# Patient Record
Sex: Female | Born: 1970
Health system: Southern US, Community
[De-identification: ages and names within clinical notes are randomized; demographics above are authoritative.]

## PROBLEM LIST (undated history)

## (undated) DIAGNOSIS — Z9889 Other specified postprocedural states: Secondary | ICD-10-CM

## (undated) DIAGNOSIS — R5382 Chronic fatigue, unspecified: Secondary | ICD-10-CM

## (undated) DIAGNOSIS — T8859XA Other complications of anesthesia, initial encounter: Secondary | ICD-10-CM

## (undated) DIAGNOSIS — Z789 Other specified health status: Secondary | ICD-10-CM

## (undated) DIAGNOSIS — E039 Hypothyroidism, unspecified: Secondary | ICD-10-CM

## (undated) DIAGNOSIS — E1165 Type 2 diabetes mellitus with hyperglycemia: Secondary | ICD-10-CM

## (undated) DIAGNOSIS — R112 Nausea with vomiting, unspecified: Secondary | ICD-10-CM

## (undated) DIAGNOSIS — M797 Fibromyalgia: Secondary | ICD-10-CM

## (undated) DIAGNOSIS — F410 Panic disorder [episodic paroxysmal anxiety] without agoraphobia: Secondary | ICD-10-CM

## (undated) DIAGNOSIS — IMO0001 Reserved for inherently not codable concepts without codable children: Secondary | ICD-10-CM

## (undated) DIAGNOSIS — D649 Anemia, unspecified: Secondary | ICD-10-CM

## (undated) DIAGNOSIS — T4145XA Adverse effect of unspecified anesthetic, initial encounter: Secondary | ICD-10-CM

## (undated) DIAGNOSIS — E079 Disorder of thyroid, unspecified: Secondary | ICD-10-CM

## (undated) HISTORY — PX: OTHER SURGICAL HISTORY: SHX169

## (undated) HISTORY — DX: Disorder of thyroid, unspecified: E07.9

## (undated) HISTORY — DX: Panic disorder (episodic paroxysmal anxiety): F41.0

## (undated) HISTORY — DX: Chronic fatigue, unspecified: R53.82

## (undated) HISTORY — DX: Anemia, unspecified: D64.9

## (undated) HISTORY — DX: Fibromyalgia: M79.7

## (undated) HISTORY — DX: Reserved for inherently not codable concepts without codable children: IMO0001

## (undated) HISTORY — PX: POLYPECTOMY: SHX149

## (undated) HISTORY — DX: Type 2 diabetes mellitus with hyperglycemia: E11.65

---
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
## 2006-01-16 ENCOUNTER — Inpatient Hospital Stay (HOSPITAL_COMMUNITY): Admission: AD | Admit: 2006-01-16 | Discharge: 2006-01-17 | Payer: Self-pay | Admitting: Psychiatry

## 2006-01-16 ENCOUNTER — Ambulatory Visit: Payer: Self-pay | Admitting: Psychiatry

## 2010-02-04 ENCOUNTER — Ambulatory Visit (HOSPITAL_COMMUNITY): Admission: RE | Admit: 2010-02-04 | Discharge: 2010-02-04 | Payer: Self-pay | Admitting: Obstetrics and Gynecology

## 2010-03-24 ENCOUNTER — Ambulatory Visit (HOSPITAL_COMMUNITY): Admission: RE | Admit: 2010-03-24 | Discharge: 2010-03-24 | Payer: Self-pay | Admitting: Obstetrics and Gynecology

## 2010-08-17 LAB — CBC
HCT: 39.8 % (ref 36.0–46.0)
MCH: 31 pg (ref 26.0–34.0)
MCHC: 34.1 g/dL (ref 30.0–36.0)
MCV: 90.8 fL (ref 78.0–100.0)
Platelets: 255 10*3/uL (ref 150–400)
RDW: 12.7 % (ref 11.5–15.5)
WBC: 7.3 10*3/uL (ref 4.0–10.5)

## 2010-08-18 LAB — CBC
HCT: 39.3 % (ref 36.0–46.0)
Hemoglobin: 13.3 g/dL (ref 12.0–15.0)
MCH: 30.9 pg (ref 26.0–34.0)
MCHC: 33.8 g/dL (ref 30.0–36.0)
MCV: 91.5 fL (ref 78.0–100.0)
RDW: 13 % (ref 11.5–15.5)

## 2010-10-21 NOTE — Discharge Summary (Signed)
Sherri Cross, KINKEAD NO.:  [id number]   MEDICAL RECORD NO.:  [id number]          PATIENT TYPE:  IPS   LOCATION:  0302                          FACILITY:  [facility]   PHYSICIAN:  Geoffery Lyons, M.D.      DATE OF BIRTH:  [birth year]   DATE OF ADMISSION:  01/16/2006  DATE OF DISCHARGE:  01/17/2006                                 DISCHARGE SUMMARY   CHIEF COMPLAINT AND PRESENT ILLNESS:  This was the first admission to [facility] for this 40 year old female from [place].  Voluntarily  admitted.  Referred by her PCP after she expressed to them that she has had  suicidal thoughts of hanging herself.  Upon this evaluation, she denied any  intent for harm.  Endorsed depressed mood, low energy, anhedonia, decreased  motivation and concentration.  On Lexapro for two weeks.  Poor results and  side effects.  Her PCP is new for her and instructed her to come to the  hospital or will petition.   PAST PSYCHIATRIC HISTORY:  First time at [facility].  Managed by her  PCP.  History of depression.  Triggered by situational stressors.   MEDICAL HISTORY:  Noncontributory.   MEDICATIONS:  Lexapro 20 mg with involuntary muscle twitching and sexual  side effects and hyperhidrosis.   PHYSICAL EXAMINATION:  Performed and failed to show any acute findings.   LABORATORY DATA:  Not available in the chart.   MEDICATIONS:  Lexapro 20 mg per day, Klonopin 0.25 mg twice a day, Singulair  and Zyrtec.   MENTAL STATUS EXAM:  Alert, cooperative female.  Pleasant, cooperative.  Bright affect.  Speech normal rate, tempo and production.  Mood euthymic.  Affect broad.  Thought processes logical, coherent and relevant.  Endorsed  no suicidal or homicidal ideation.  Endorsed that she did not need to be in  the hospital.  Wanting to be discharged.  No evidence of delusions.  No  suicidal or homicidal ideation.  No hallucinations.  No delusions.   ADMISSION DIAGNOSES:  AXIS I:  Major  depression, recurrent.  AXIS II:  No diagnosis.  AXIS III:  No diagnosis.  AXIS IV:  Moderate.  AXIS V:  GAF upon admission 35; highest GAF in the last year 75.   HOSPITAL COURSE:  She was admitted and she was started in individual and  group psychotherapy.  We maintained the Klonopin, Zyrtec, Singulair.  Lexapro was discontinued and she had already started weaning off it and she  was started on Effexor XR 37.5 mg per day.  She shared that some of the  stressors were her parents, financial problems and the __________ petition  that she will take care of them.  When she was told that she was going to be  discharged to make the best use out of her stay in the unit, her whole  attitude changed.  Her mood improved.  Her affect became brighter.  She was  very pleasant.  She was insightful.  She tolerated the Effexor well without  the side effects.  She was willing and motivated to pursue outpatient  treatment, so we went ahead and discharged to outpatient follow-up.   DISCHARGE DIAGNOSES:  AXIS I:  Major depression, recurrent.  AXIS II:  No diagnosis.  AXIS III:  No diagnosis.  AXIS IV:  Moderate.  AXIS V:  GAF upon discharge 55-60.   DISCHARGE MEDICATIONS:  1. Effexor XR 37.5 mg per day.  2. Klonopin 0.5 mg, 1/2 a tab twice a day.   FOLLOWUP:  Dr. Wynonia Lawman at the [facility].      Geoffery Lyons, M.D.  Electronically Signed     IL/MEDQ  D:  01/31/2006  T:  01/31/2006  Job:  [id number]

## 2012-06-20 ENCOUNTER — Other Ambulatory Visit: Payer: Self-pay | Admitting: *Deleted

## 2012-06-20 ENCOUNTER — Other Ambulatory Visit: Payer: Self-pay | Admitting: Internal Medicine

## 2012-06-20 DIAGNOSIS — Z1231 Encounter for screening mammogram for malignant neoplasm of breast: Secondary | ICD-10-CM

## 2012-06-20 DIAGNOSIS — N852 Hypertrophy of uterus: Secondary | ICD-10-CM

## 2012-06-26 ENCOUNTER — Ambulatory Visit
Admission: RE | Admit: 2012-06-26 | Discharge: 2012-06-26 | Disposition: A | Discharge status: Home / Self Care | Payer: BC Managed Care – PPO | Source: Ambulatory Visit | Attending: *Deleted | Admitting: *Deleted

## 2012-06-26 DIAGNOSIS — N852 Hypertrophy of uterus: Secondary | ICD-10-CM

## 2012-07-12 ENCOUNTER — Ambulatory Visit
Admission: RE | Admit: 2012-07-12 | Discharge: 2012-07-12 | Disposition: A | Discharge status: Home / Self Care | Payer: BC Managed Care – PPO | Source: Ambulatory Visit | Attending: Internal Medicine | Admitting: Internal Medicine

## 2012-07-12 DIAGNOSIS — Z1231 Encounter for screening mammogram for malignant neoplasm of breast: Secondary | ICD-10-CM

## 2012-07-15 ENCOUNTER — Other Ambulatory Visit: Payer: Self-pay | Admitting: Internal Medicine

## 2012-07-15 DIAGNOSIS — R928 Other abnormal and inconclusive findings on diagnostic imaging of breast: Secondary | ICD-10-CM

## 2013-01-16 ENCOUNTER — Ambulatory Visit: Payer: BC Managed Care – PPO | Admitting: Physician Assistant

## 2013-01-16 VITALS — BP 100/78 | HR 91 | Temp 98.0°F | Resp 16 | Ht 61.0 in | Wt 169.0 lb

## 2013-01-16 DIAGNOSIS — N390 Urinary tract infection, site not specified: Secondary | ICD-10-CM

## 2013-01-16 DIAGNOSIS — R300 Dysuria: Secondary | ICD-10-CM

## 2013-01-16 DIAGNOSIS — R350 Frequency of micturition: Secondary | ICD-10-CM

## 2013-01-16 DIAGNOSIS — R3913 Splitting of urinary stream: Secondary | ICD-10-CM

## 2013-01-16 LAB — POCT URINALYSIS DIPSTICK
Bilirubin, UA: 100
Blood, UA: NEGATIVE
Glucose, UA: NEGATIVE
Nitrite, UA: POSITIVE
Protein, UA: 100
Spec Grav, UA: <=1.005
Urobilinogen, UA: 4
pH, UA: 5

## 2013-01-16 LAB — POCT UA - MICROSCOPIC ONLY
Casts, Ur, LPF, POC: NEGATIVE
Crystals, Ur, HPF, POC: NEGATIVE
Mucus, UA: NEGATIVE
Yeast, UA: NEGATIVE

## 2013-01-16 NOTE — Progress Notes (Signed)
  Subjective:    Patient ID: Sherri Cross, female    DOB: June 26, 1970, 42 y.o.   MRN: [id number]  HPI 42 year old female presents with 3 day history of urinary frequency and dysuria. States symptoms started suddenly and have progressively worsened.  Last night symptoms got acutely worse and so she took OTC Azo for comfort which did help.  Denies abdominal pain, nausea, vomiting, fever, chills, vaginal discharge, or dizziness.  She does have a history of urinary tract infections with the last about 2 years ago. Was seen here and was given Macrobid but she did have an allergy to this.   Patient is otherwise doing well with no other concerns today.      Review of Systems  Constitutional: Negative for fever and chills.  Gastrointestinal: Negative for nausea, vomiting and abdominal pain.  Genitourinary: Positive for dysuria and frequency. Negative for flank pain and vaginal discharge.  Neurological: Negative for dizziness.       Objective:   Physical Exam  Constitutional: She is oriented to person, place, and time. She appears well-developed and well-nourished.  HENT:  Head: Normocephalic and atraumatic.  Right Ear: External ear normal.  Left Ear: External ear normal.  Eyes: Conjunctivae are normal.  Neck: Normal range of motion.  Cardiovascular: Normal rate, regular rhythm and normal heart sounds.   Pulmonary/Chest: Effort normal and breath sounds normal.  Abdominal: Soft. Bowel sounds are normal. There is no tenderness. There is no rebound and no guarding.  Neurological: She is alert and oriented to person, place, and time.  Psychiatric: She has a normal mood and affect. Her behavior is normal. Judgment and thought content normal.          Assessment & Plan:  UTI (urinary tract infection)  Urinary frequency - Plan: POCT UA - Microscopic Only, POCT urinalysis dipstick, Urine culture  Dysuria  Culture from 2012 revealed E. Coli with multi-drug resistance including Cipro,  Septra, and several Cephalosporins Will start on Doxy 100 mg bid x 5 days - given from our stock Urine culture pending - added tetracycline and keflex to sensitivity Follow up if symptoms worsen or fail to improve.

## 2013-01-18 LAB — URINE CULTURE: Colony Count: >=100000

## 2013-10-21 ENCOUNTER — Ambulatory Visit: Payer: BC Managed Care – PPO

## 2013-10-21 ENCOUNTER — Ambulatory Visit: Payer: BC Managed Care – PPO | Admitting: Family Medicine

## 2013-10-21 VITALS — BP 128/84 | HR 90 | Temp 98.1°F | Resp 16

## 2013-10-21 DIAGNOSIS — W19XXXA Unspecified fall, initial encounter: Secondary | ICD-10-CM

## 2013-10-21 DIAGNOSIS — M79671 Pain in right foot: Secondary | ICD-10-CM

## 2013-10-21 DIAGNOSIS — S93402A Sprain of unspecified ligament of left ankle, initial encounter: Secondary | ICD-10-CM

## 2013-10-21 DIAGNOSIS — M79609 Pain in unspecified limb: Secondary | ICD-10-CM

## 2013-10-21 DIAGNOSIS — S93409A Sprain of unspecified ligament of unspecified ankle, initial encounter: Secondary | ICD-10-CM

## 2013-10-21 DIAGNOSIS — S92901A Unspecified fracture of right foot, initial encounter for closed fracture: Secondary | ICD-10-CM

## 2013-10-21 NOTE — Patient Instructions (Signed)
Sherri Cross [phone]   Please bring the copies of your chart to your appointment with you.

## 2013-10-21 NOTE — Progress Notes (Signed)
Is a 43 year old woman who was trying to rescue a dove from her cat when she twisted her right foot and twisted her left ankle. She had immediate swelling [place] had difficulty bearing weight.  Objective: Large ecchymotic area over the right lateral foot and large amount of swelling over the left lateral malleolus. There is tenderness there as well.  [facility] reading (PRIMARY) by  Dr. Joseph Art: Right base of Vth metatarsal fractured with small amount of displacement, left ankle shows STS only.  Assessment: Right metatarsal fracture, fifth, minimally displaced with left ankle sprain  Plan: Refer to orthopedics and splint both areas.  Signed, Ozella Rocks.D.

## 2014-04-09 ENCOUNTER — Other Ambulatory Visit: Payer: Self-pay | Admitting: Orthopedic Surgery

## 2014-04-09 DIAGNOSIS — M79671 Pain in right foot: Secondary | ICD-10-CM

## 2014-04-10 ENCOUNTER — Ambulatory Visit
Admission: RE | Admit: 2014-04-10 | Discharge: 2014-04-10 | Disposition: A | Discharge status: Home / Self Care | Payer: BC Managed Care – PPO | Source: Ambulatory Visit | Attending: Orthopedic Surgery | Admitting: Orthopedic Surgery

## 2014-04-10 DIAGNOSIS — M79671 Pain in right foot: Secondary | ICD-10-CM

## 2015-08-26 IMAGING — CT CT FOOT*R* W/O CM
4 series · 11 of 20 positions shown, 12 images · non-contrast
Comparison: Radiographs dated 10/21/2013

CLINICAL DATA: Right foot pain since a fracture of the base of the
fifth metatarsal 6 months ago.

EXAM:
CT OF THE RIGHT FOOT WITHOUT CONTRAST
TECHNIQUE: Multidetector CT imaging of the right foot was performed according
to the standard protocol. Multiplanar CT image reconstructions were
also generated.

[Series 5: lower ext soft · axial · 0.48mm/px · z∈[-82,+22]mm · 4 of 70 slices shown]
[im 14/70  soft-tissue]
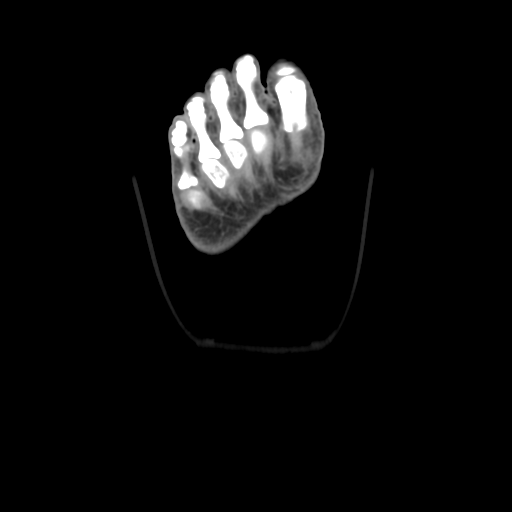
[im 28/70  soft-tissue]
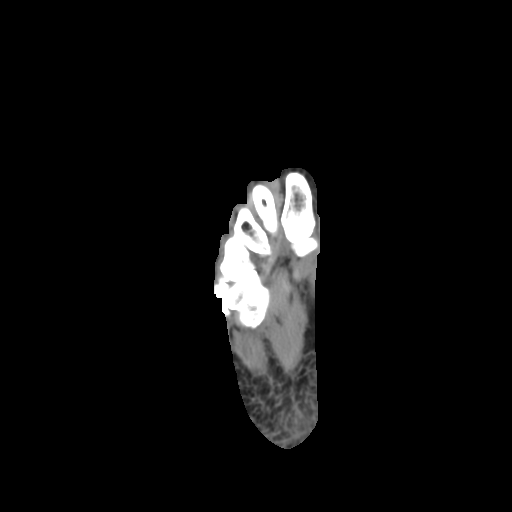
[im 42/70  soft-tissue]
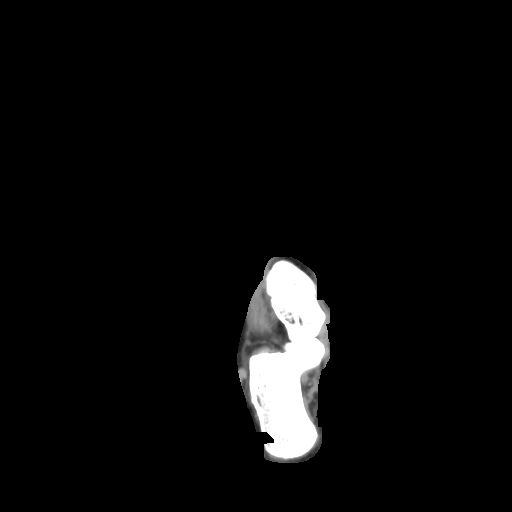
[im 56/70  soft-tissue]
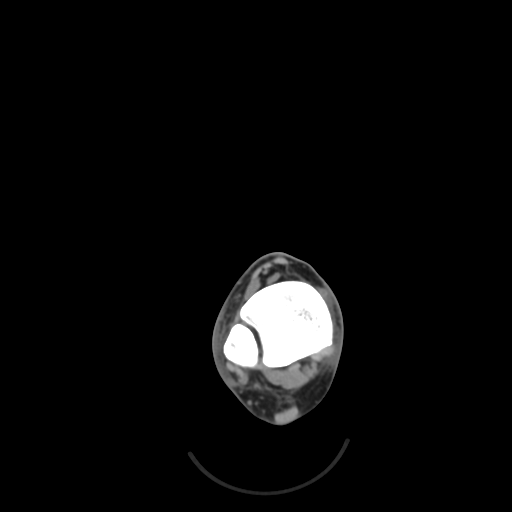

[Series 300: cor soft · axial · 0.48mm/px · z∈[-129,-99]mm · 2 of 55 slices shown, 3 images]
[im 19/55  soft-tissue]
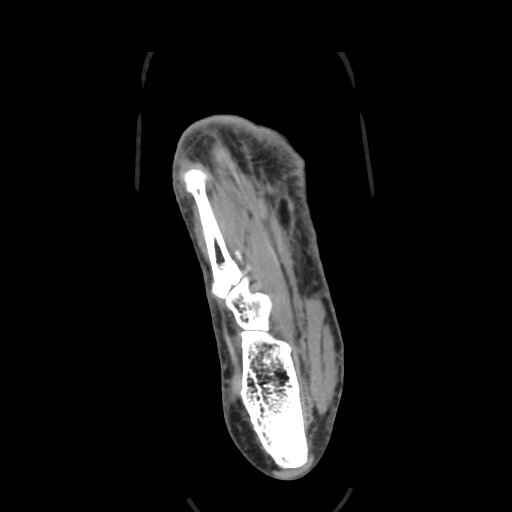
[im 19/55  bone]
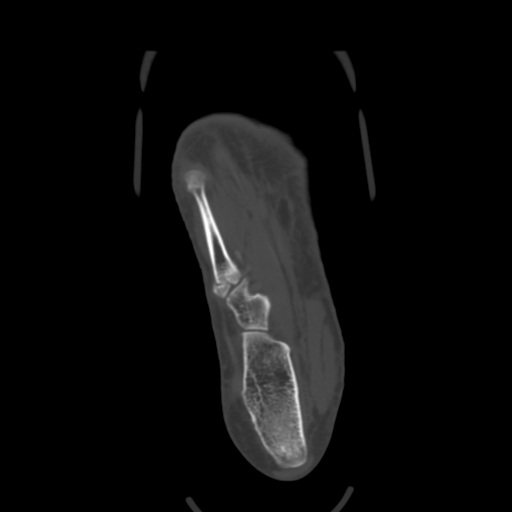
[im 37/55  bone]
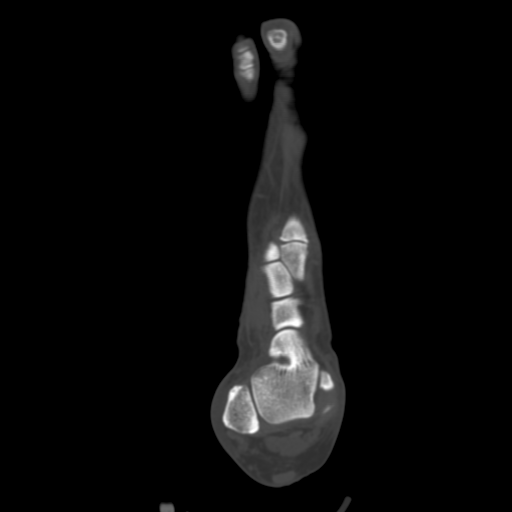

[Series 301: sag soft · sagittal · 0.48mm/px · 2 of 49 slices shown]
[im 17/49  soft-tissue]
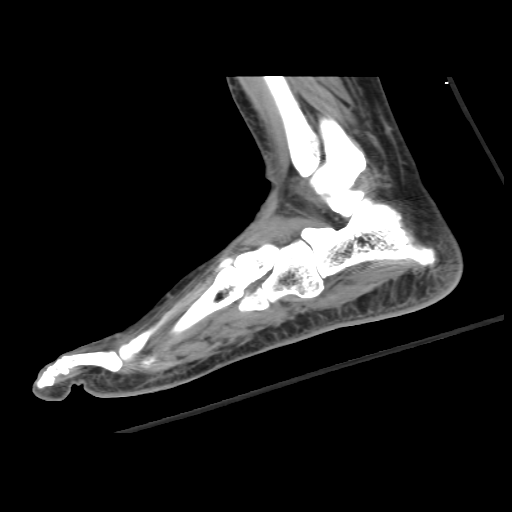
[im 33/49  soft-tissue]
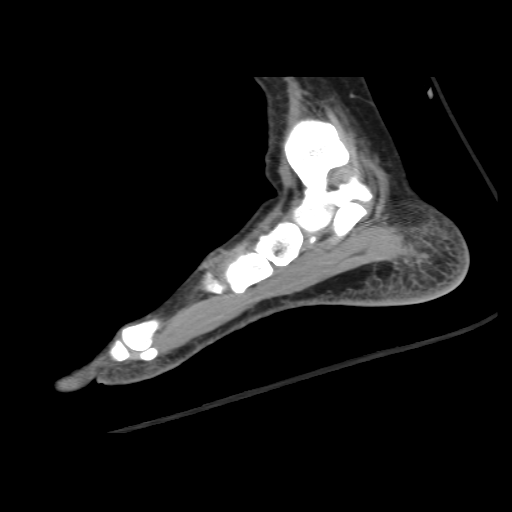

[Series 303: soft axial · coronal · 0.48mm/px · 3 of 114 slices shown]
[im 39/114  bone]
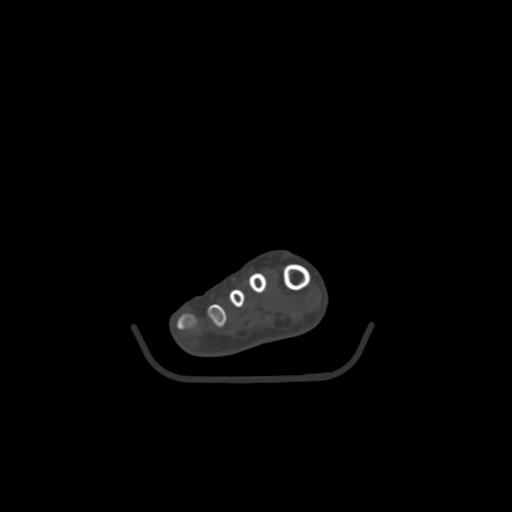
[im 51/114  bone]
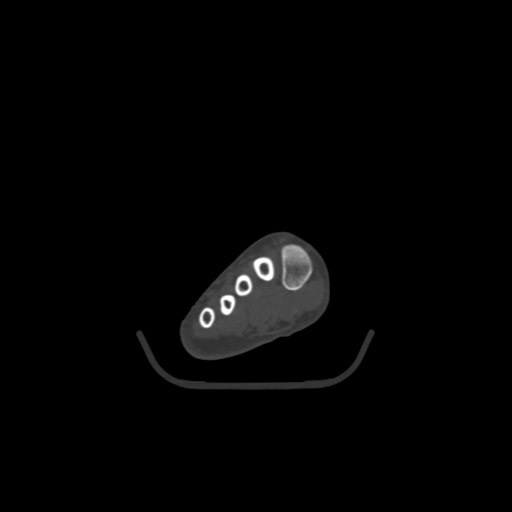
[im 63/114  bone]
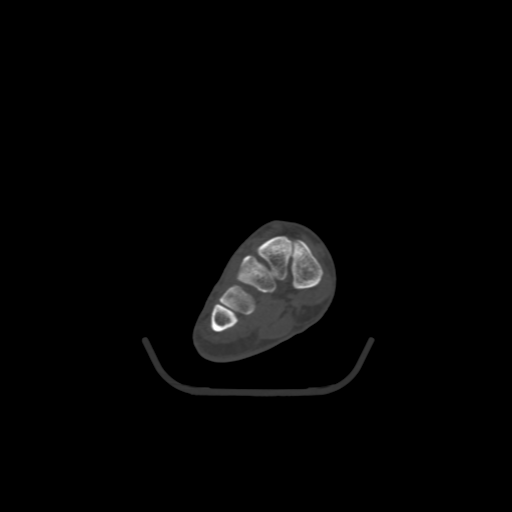

[11 of 20 positions shown; findings below may reference images not displayed]

FINDINGS: There is nonunion of the transverse fracture of the base of the
fifth metatarsal. There is only minimal lateral displacement of the
proximal fragment. The margins of the fracture are sclerotic. The
bones of the foot are otherwise normal except for a small plantar
calcaneal spur.

The soft tissues appear normal.
IMPRESSION: Nonunion fracture of the base of the fifth metatarsal.

## 2015-10-21 ENCOUNTER — Encounter: Payer: Self-pay | Admitting: Family Medicine

## 2015-10-21 ENCOUNTER — Ambulatory Visit (INDEPENDENT_AMBULATORY_CARE_PROVIDER_SITE_OTHER): Payer: 59 | Admitting: Family Medicine

## 2015-10-21 VITALS — BP 134/76 | HR 94 | Temp 98.4°F | Ht 61.3 in | Wt 196.2 lb

## 2015-10-21 DIAGNOSIS — Z131 Encounter for screening for diabetes mellitus: Secondary | ICD-10-CM | POA: Diagnosis not present

## 2015-10-21 DIAGNOSIS — F419 Anxiety disorder, unspecified: Secondary | ICD-10-CM | POA: Diagnosis not present

## 2015-10-21 DIAGNOSIS — G4700 Insomnia, unspecified: Secondary | ICD-10-CM | POA: Diagnosis not present

## 2015-10-21 DIAGNOSIS — Z130 Encounter for screening for diseases of the blood and blood-forming organs and certain disorders involving the immune mechanism: Secondary | ICD-10-CM

## 2015-10-21 DIAGNOSIS — Z1321 Encounter for screening for nutritional disorder: Secondary | ICD-10-CM | POA: Diagnosis not present

## 2015-10-21 DIAGNOSIS — E038 Other specified hypothyroidism: Secondary | ICD-10-CM

## 2015-10-21 DIAGNOSIS — F410 Panic disorder [episodic paroxysmal anxiety] without agoraphobia: Secondary | ICD-10-CM | POA: Insufficient documentation

## 2015-10-21 DIAGNOSIS — E034 Atrophy of thyroid (acquired): Secondary | ICD-10-CM | POA: Diagnosis not present

## 2015-10-21 HISTORY — DX: Panic disorder (episodic paroxysmal anxiety): F41.0

## 2015-10-21 MED ORDER — CLONAZEPAM 1 MG PO TABS: 1.0000 mg | ORAL_TABLET | Freq: Two times a day (BID) | ORAL | Status: DC | PRN

## 2015-10-21 MED ORDER — ALPRAZOLAM 1 MG PO TABS: ORAL_TABLET | ORAL | Status: DC

## 2015-10-21 NOTE — Patient Instructions (Addendum)
I will be in touch with your labs asap You can use the klonopin twice a day as needed for your anxiety. If you have a panic attack you can take a 1/2 mg of xanax. Remember these medications should not be combined with alcohol- they can also make you sleepy so do not take them if you have to drive.    Let's plan to check back in a few months for a physical exam  Black cohash maybe helpful for your hot flashes

## 2015-10-21 NOTE — Progress Notes (Signed)
Pre visit review using our clinic tool,if applicable. No additional management support is needed unless otherwise documented below in the visit note.  

## 2015-10-21 NOTE — Progress Notes (Signed)
[place] at [facility] [address], [place], [place] [address] [phone]  Date:  10/21/2015   Name:  Sherri Cross   DOB:  1971-01-11   MRN:  [id number]  PCP:  Lamar Blinks, MD    Chief Complaint: Establish Care   History of Present Illness:  Sherri Cross is a 45 y.o. very pleasant female patient who presents with the following:  Here today to re-establish care.  I have seen her in the past but it has been a few years.   Her main complaint today is family stress.  She has a lot of problems with her parents. Her father wasted their money, so her mom and dad are now living with her.  There are continuing property disputes in the family, both in the [place] and in [place]  She is not able to sleep- she has tried lots of medications for sleep but the only thing that has worked for her is [employer]. She has been taking this at night for 10 years or so.    She will sometimes have what sounds like panic attacks where "I feel like a chemical is running through my body, and my head is spinning and I feel like I can't stop."  She will take a xanax if she has a bad panic attack  She had been getting adderall also from Debbe Bales at [place]- she would prefer to see [place] now due to some difficulty getting her refills in the past.    She has taken klonopin up to 2mg  total during the day, adderall 10 BID, and part of a 1mg  xanax prn for panic- she might use xanax twice a week since she has been out of her klonpin. States that she never takes both klonpin and xanax in the same time period.  She ran out of klonopin 3 weeks ago- her last fill was actually in December  She takes 1mg  of xanax prn- she takes a 1/2 generally   Admitted to [facility] just overnight about 10 years ago for suicidally.  Denies any SI at this time  She is also concerned about vitamin D and B12 def as she is a vegitarian   LMP - she is on continuous OCP  There  are no active problems to display for this patient.   No past medical history on file.  No past surgical history on file.  Social History  Substance Use Topics  . Smoking status: Never Smoker   . Smokeless tobacco: None  . Alcohol Use: No    No family history on file.  No Known Allergies  Medication list has been reviewed and updated.  Current Outpatient Prescriptions on File Prior to Visit  Medication Sig Dispense Refill  . amphetamine-dextroamphetamine (ADDERALL) 10 MG tablet Take 10 mg by mouth daily.    . clonazePAM (KLONOPIN) 1 MG tablet Take 1 mg by mouth 2 (two) times daily as needed for anxiety.    . cyclobenzaprine (FLEXERIL) 10 MG tablet Take 10 mg by mouth 3 (three) times daily as needed for muscle spasms.    Marland Kitchen thyroid (ARMOUR THYROID) 30 MG tablet Take 30 mg by mouth daily before breakfast.     No current facility-administered medications on file prior to visit.    Review of Systems:  As per HPI- otherwise negative.   Physical Examination: Filed Vitals:   10/21/15 1443  BP: 134/76  Pulse: 94  Temp: 98.4 F (36.9  C)   Filed Vitals:   10/21/15 1443  Height: 5' 1.3" (1.557 m)  Weight: 196 lb 3.2 oz (88.996 kg)   Body mass index is 36.71 kg/(m^2). Ideal Body Weight: Weight in (lb) to have BMI = 25: 133.3  GEN: WDWN, NAD, Non-toxic, A & O x 3, obese, looks well HEENT: Atraumatic, Normocephalic. Neck supple. No masses, No LAD. Ears and Nose: No external deformity. CV: RRR, No M/G/R. No JVD. No thrill. No extra heart sounds. PULM: CTA B, no wheezes, crackles, rhonchi. No retractions. No resp. distress. No accessory muscle use. EXTR: No c/c/e NEURO Normal gait.  PSYCH: Normally interactive. Conversant. Not depressed or anxious appearing.  Calm demeanor.    Assessment and Plan: Anxiety disorder, unspecified - Plan: clonazePAM (KLONOPIN) 1 MG tablet  Insomnia - Plan: clonazePAM (KLONOPIN) 1 MG tablet  Hypothyroidism due to acquired atrophy of  thyroid - Plan: TSH  Screening for diabetes mellitus - Plan: Hemoglobin A1c, Comprehensive metabolic panel  Screening for deficiency anemia - Plan: CBC  Encounter for vitamin deficiency screening - Plan: Vitamin D (25 hydroxy), B12  Panic attacks - Plan: ALPRAZolam (XANAX) 1 MG tablet  Here today to reestablish care Vitamin levels and other screening labs as above Refilled klonopin that she can take BID if needed Also gave a small supply of xanax to use if needed for acute panic.  Cautioned her not to ever use xanax and klonpin in the same time period due to risk of excess sedation.  Also do not combine with alcohol  Meds ordered this encounter  Medications  . clonazePAM (KLONOPIN) 1 MG tablet    Sig: Take 1 tablet (1 mg total) by mouth 2 (two) times daily as needed for anxiety.    Dispense:  60 tablet    Refill:  1  . ALPRAZolam (XANAX) 1 MG tablet    Sig: Take 1/2 or 1 daily as needed for panic attacks    Dispense:  20 tablet    Refill:  0     Signed Lamar Blinks, MD

## 2015-10-22 LAB — CBC
HEMATOCRIT: 41 % (ref 36.0–46.0)
HEMOGLOBIN: 13.7 g/dL (ref 12.0–15.0)
MCHC: 33.3 g/dL (ref 30.0–36.0)
MCV: 88.2 fl (ref 78.0–100.0)
PLATELETS: 307 10*3/uL (ref 150.0–400.0)
RBC: 4.65 Mil/uL (ref 3.87–5.11)
RDW: 13.7 % (ref 11.5–15.5)
WBC: 10.5 10*3/uL (ref 4.0–10.5)

## 2015-10-22 LAB — COMPREHENSIVE METABOLIC PANEL
ALBUMIN: 3.8 g/dL (ref 3.5–5.2)
ALT: 22 U/L (ref 0–35)
AST: 21 U/L (ref 0–37)
Alkaline Phosphatase: 45 U/L (ref 39–117)
BUN: 8 mg/dL (ref 6–23)
CALCIUM: 9 mg/dL (ref 8.4–10.5)
CHLORIDE: 105 meq/L (ref 96–112)
CO2: 27 mEq/L (ref 19–32)
Creatinine, Ser: 0.65 mg/dL (ref 0.40–1.20)
GFR: 105.01 mL/min (ref >60.00)
Glucose, Bld: 152 mg/dL — ABNORMAL HIGH (ref 70–99)
POTASSIUM: 4.6 meq/L (ref 3.5–5.1)
Sodium: 137 mEq/L (ref 135–145)
Total Bilirubin: 0.3 mg/dL (ref 0.2–1.2)
Total Protein: 7 g/dL (ref 6.0–8.3)

## 2015-10-22 LAB — TSH: TSH: 1.04 u[IU]/mL (ref 0.35–4.50)

## 2015-10-22 LAB — VITAMIN D 25 HYDROXY (VIT D DEFICIENCY, FRACTURES): VITD: 26.58 ng/mL — AB (ref 30.00–100.00)

## 2015-10-22 LAB — HEMOGLOBIN A1C: Hgb A1c MFr Bld: 7.9 % — ABNORMAL HIGH (ref 4.6–6.5)

## 2015-10-22 LAB — VITAMIN B12: Vitamin B-12: 143 pg/mL — ABNORMAL LOW (ref 211–911)

## 2015-10-28 ENCOUNTER — Encounter: Payer: Self-pay | Admitting: Family Medicine

## 2015-10-28 DIAGNOSIS — E1165 Type 2 diabetes mellitus with hyperglycemia: Principal | ICD-10-CM

## 2015-10-28 DIAGNOSIS — IMO0001 Reserved for inherently not codable concepts without codable children: Secondary | ICD-10-CM | POA: Insufficient documentation

## 2015-12-22 ENCOUNTER — Encounter: Payer: 59 | Admitting: Family Medicine

## 2015-12-22 DIAGNOSIS — Z0289 Encounter for other administrative examinations: Secondary | ICD-10-CM

## 2015-12-23 ENCOUNTER — Telehealth: Payer: Self-pay | Admitting: Family Medicine

## 2015-12-23 NOTE — Telephone Encounter (Signed)
Patent was No Show for CPE July 19. Charge or No Charge?

## 2015-12-23 NOTE — Telephone Encounter (Signed)
charge 

## 2015-12-24 ENCOUNTER — Encounter: Payer: Self-pay | Admitting: Family Medicine

## 2016-01-26 ENCOUNTER — Other Ambulatory Visit (HOSPITAL_COMMUNITY)
Admission: RE | Admit: 2016-01-26 | Discharge: 2016-01-26 | Disposition: A | Discharge status: Home / Self Care | Payer: 59 | Source: Ambulatory Visit | Attending: Family Medicine | Admitting: Family Medicine

## 2016-01-26 ENCOUNTER — Ambulatory Visit (INDEPENDENT_AMBULATORY_CARE_PROVIDER_SITE_OTHER): Payer: 59 | Admitting: Family Medicine

## 2016-01-26 ENCOUNTER — Encounter: Payer: Self-pay | Admitting: Family Medicine

## 2016-01-26 VITALS — BP 124/82 | HR 94 | Temp 97.9°F | Ht 61.0 in | Wt 184.2 lb

## 2016-01-26 DIAGNOSIS — Z23 Encounter for immunization: Secondary | ICD-10-CM

## 2016-01-26 DIAGNOSIS — G4700 Insomnia, unspecified: Secondary | ICD-10-CM

## 2016-01-26 DIAGNOSIS — F419 Anxiety disorder, unspecified: Secondary | ICD-10-CM

## 2016-01-26 DIAGNOSIS — E1165 Type 2 diabetes mellitus with hyperglycemia: Secondary | ICD-10-CM

## 2016-01-26 DIAGNOSIS — E038 Other specified hypothyroidism: Secondary | ICD-10-CM

## 2016-01-26 DIAGNOSIS — IMO0001 Reserved for inherently not codable concepts without codable children: Secondary | ICD-10-CM

## 2016-01-26 DIAGNOSIS — Z01419 Encounter for gynecological examination (general) (routine) without abnormal findings: Secondary | ICD-10-CM | POA: Diagnosis present

## 2016-01-26 DIAGNOSIS — E538 Deficiency of other specified B group vitamins: Secondary | ICD-10-CM | POA: Diagnosis not present

## 2016-01-26 DIAGNOSIS — Z124 Encounter for screening for malignant neoplasm of cervix: Secondary | ICD-10-CM

## 2016-01-26 DIAGNOSIS — E034 Atrophy of thyroid (acquired): Secondary | ICD-10-CM

## 2016-01-26 DIAGNOSIS — Z1151 Encounter for screening for human papillomavirus (HPV): Secondary | ICD-10-CM | POA: Diagnosis present

## 2016-01-26 DIAGNOSIS — N912 Amenorrhea, unspecified: Secondary | ICD-10-CM | POA: Diagnosis not present

## 2016-01-26 DIAGNOSIS — Z0000 Encounter for general adult medical examination without abnormal findings: Secondary | ICD-10-CM | POA: Diagnosis not present

## 2016-01-26 DIAGNOSIS — F410 Panic disorder [episodic paroxysmal anxiety] without agoraphobia: Secondary | ICD-10-CM

## 2016-01-26 LAB — HEMOGLOBIN A1C: Hgb A1c MFr Bld: 7.3 % — ABNORMAL HIGH (ref 4.6–6.5)

## 2016-01-26 LAB — LIPID PANEL
CHOL/HDL RATIO: 7
Cholesterol: 222 mg/dL — ABNORMAL HIGH (ref 0–200)
HDL: 32.5 mg/dL — ABNORMAL LOW (ref >39.00)
LDL CALC: 165 mg/dL — AB (ref 0–99)
NONHDL: 189.2
Triglycerides: 120 mg/dL (ref 0.0–149.0)
VLDL: 24 mg/dL (ref 0.0–40.0)

## 2016-01-26 LAB — MICROALBUMIN / CREATININE URINE RATIO
Creatinine,U: 210.4 mg/dL
MICROALB UR: 4.1 mg/dL — AB (ref 0.0–1.9)
MICROALB/CREAT RATIO: 1.9 mg/g (ref 0.0–30.0)

## 2016-01-26 LAB — BASIC METABOLIC PANEL
BUN: 7 mg/dL (ref 6–23)
CALCIUM: 8.6 mg/dL (ref 8.4–10.5)
CO2: 25 mEq/L (ref 19–32)
CREATININE: 0.74 mg/dL (ref 0.40–1.20)
Chloride: 102 mEq/L (ref 96–112)
GFR: 90.31 mL/min (ref >60.00)
GLUCOSE: 144 mg/dL — AB (ref 70–99)
Potassium: 3.6 mEq/L (ref 3.5–5.1)
SODIUM: 135 meq/L (ref 135–145)

## 2016-01-26 LAB — POCT URINE PREGNANCY: PREG TEST UR: NEGATIVE

## 2016-01-26 LAB — VITAMIN B12: Vitamin B-12: 302 pg/mL (ref 211–911)

## 2016-01-26 MED ORDER — ALPRAZOLAM 1 MG PO TABS: ORAL_TABLET | ORAL | 0 refills | Status: DC

## 2016-01-26 MED ORDER — CLONAZEPAM 1 MG PO TABS: 1.0000 mg | ORAL_TABLET | Freq: Two times a day (BID) | ORAL | 1 refills | Status: DC | PRN

## 2016-01-26 MED ORDER — LEVONORGESTREL-ETHINYL ESTRAD 0.1-20 MG-MCG PO TABS: 1.0000 | ORAL_TABLET | Freq: Every day | ORAL | 11 refills | Status: DC

## 2016-01-26 MED ORDER — BUPROPION HCL ER (SR) 150 MG PO TB12: 150.0000 mg | ORAL_TABLET | Freq: Every day | ORAL | 5 refills | Status: DC

## 2016-01-26 MED ORDER — METFORMIN HCL 500 MG PO TABS: 500.0000 mg | ORAL_TABLET | Freq: Two times a day (BID) | ORAL | 5 refills | Status: DC

## 2016-01-26 MED ORDER — THYROID 30 MG PO TABS: 30.0000 mg | ORAL_TABLET | Freq: Every day | ORAL | 3 refills | Status: DC

## 2016-01-26 NOTE — Progress Notes (Signed)
Pre visit review using our clinic review tool, if applicable. No additional management support is needed unless otherwise documented below in the visit note. 

## 2016-01-26 NOTE — Patient Instructions (Addendum)
It was very nice to see you today- I am hopeful that things will improve in your family soon! Start on the metformin for your diabetes; take it once daily for one week, then try to increase to twice a day. We will also start you on wellbutrin once a day  I'll be in touch with your labs asap and we will make a follow-up plan Please get an eye exam when you can; an annual eye exam is a good idea for everyone with diabetes   Also please have a mammogram soon; this can be done here at the medcenter if you like!

## 2016-01-26 NOTE — Progress Notes (Signed)
Sherri Cross at [facility] [address] [place], [place] [address] [phone] [id number] [phone]  Date:  01/26/2016   Name:  Sherri Cross   DOB:  [birth year]   MRN:  [id number]  PCP:  Lamar Blinks, MD    Chief Complaint: Annual Exam (Pt is fasting for lab work. Last PAP and mammogram about 2 yrs ago.)   History of Present Illness:  Sherri Cross is a 45 y.o. very pleasant female patient who presents with the following:  Here today for a CPE.  Dx with DM at her last visit with A1c of 7.9%.   She has not followed up until now as she was in [place] Last pap about 2 years ago, never had an abnl.   She had to spend 2 months in [place] during the summer; her parents are living there right now.  She notes that she still has a lot of family stressors. She has seen psychiatry in the past but never seemed to have a good experience there.  She does not wish to see psychiatry again; reports that she has been seen by 4 different psychiatrists in the past and that she had a better experience with primary care taking care of her mental health  LMP: she takes her OCP continuously She is on OCP; she reports a history of PCOS and endometriosis She is on an OCP called "ovelle" that she just buys OTC in [place]  She endorses a history of depresion and anxiety.  However she strongly denies any change of SI or HI. She has done well with wellbutrin in the past and would like to go back on this.  She has taken it just once a day before and plans to use it this way again She is fasting today for labs  She has been trying to get her weight under control and has lost some She would like to have a pap today Last mammo was 1-2 years ago Wt Readings from Last 3 Encounters:  01/26/16 184 lb 3.2 oz (83.6 kg)  10/21/15 196 lb 3.2 oz (89 kg)  01/16/13 169 lb (76.7 kg)   Lab Results  Component Value Date   HGBA1C 7.9 (H) 10/21/2015   Lab Results  Component Value Date   TSH  1.04 10/21/2015      Patient Active Problem List   Diagnosis Date Noted  . Diabetes mellitus type 2, uncontrolled, without complications ([place]) 0000000  . Insomnia 10/21/2015  . Anxiety disorder, unspecified 10/21/2015  . Hypothyroidism due to acquired atrophy of thyroid 10/21/2015  . Panic attacks 10/21/2015    Past Medical History:  Diagnosis Date  . Anemia   . Panic attacks 10/21/2015  . Thyroid disease     Past Surgical History:  Procedure Laterality Date  . Enometrial    . POLYPECTOMY     Uterus    Social History  Substance Use Topics  . Smoking status: Never Smoker  . Smokeless tobacco: Not on file  . Alcohol use No    No family history on file.  No Known Allergies  Medication list has been reviewed and updated.  Current Outpatient Prescriptions on File Prior to Visit  Medication Sig Dispense Refill  . ALPRAZolam (XANAX) 1 MG tablet Take 1/2 or 1 daily as needed for panic attacks 20 tablet 0  . amphetamine-dextroamphetamine (ADDERALL) 10 MG tablet Take 10 mg by mouth daily.    . clonazePAM (KLONOPIN) 1 MG tablet Take 1  tablet (1 mg total) by mouth 2 (two) times daily as needed for anxiety. 60 tablet 1  . cyclobenzaprine (FLEXERIL) 10 MG tablet Take 10 mg by mouth 3 (three) times daily as needed for muscle spasms.    Marland Kitchen thyroid (ARMOUR THYROID) 30 MG tablet Take 30 mg by mouth daily before breakfast.     No current facility-administered medications on file prior to visit.     Review of Systems:  As per HPI- otherwise negative.   Physical Examination: Vitals:   01/26/16 0934  BP: 124/82  Pulse: 94  Temp: 97.9 F (36.6 C)   Vitals:   01/26/16 0934  Weight: 184 lb 3.2 oz (83.6 kg)  Height: 5\' 1"  (1.549 m)   Body mass index is 34.8 kg/m. Ideal Body Weight: Weight in (lb) to have BMI = 25: 132  GEN: WDWN, NAD, Non-toxic, A & O x 3, overweight, looks well HEENT: Atraumatic, Normocephalic. Neck supple. No masses, No LAD. Bilateral TM wnl,  oropharynx normal.  PEERL,EOMI.   Ears and Nose: No external deformity. CV: RRR, No M/G/R. No JVD. No thrill. No extra heart sounds. PULM: CTA B, no wheezes, crackles, rhonchi. No retractions. No resp. distress. No accessory muscle use. ABD: S, NT, ND. No rebound. No HSM. EXTR: No c/c/e NEURO Normal gait.  PSYCH: Normally interactive. Conversant. Not depressed or anxious appearing.  Calm demeanor.  Breast: normal exam, no masses/ dimpling/ discharge Pelvic: normal, no vaginal lesions or discharge. Uterus normal, no CMT, no adnexal tendereness or masses  Results for orders placed or performed in visit on 01/26/16  POCT urine pregnancy  Result Value Ref Range   Preg Test, Ur Negative Negative    Assessment and Plan: Need for prophylactic vaccination against Streptococcus pneumoniae (pneumococcus) - Plan: Pneumococcal conjugate vaccine 13-valent  Uncontrolled type 2 diabetes mellitus without complication, without long-term current use of insulin ([place]) - Plan: Urine Microalbumin w/creat. ratio, Flu Vaccine QUAD 36+ mos IM, Basic metabolic panel, Hemoglobin A1c, Lipid panel, metFORMIN (GLUCOPHAGE) 500 MG tablet  Anxiety disorder, unspecified - Plan: buPROPion (WELLBUTRIN SR) 150 MG 12 hr tablet, clonazePAM (KLONOPIN) 1 MG tablet  Insomnia - Plan: clonazePAM (KLONOPIN) 1 MG tablet  Panic attacks - Plan: ALPRAZolam (XANAX) 1 MG tablet  Hypothyroidism due to acquired atrophy of thyroid - Plan: thyroid (ARMOUR THYROID) 30 MG tablet  Screening for cervical cancer - Plan: Cytology - PAP  B12 deficiency - Plan: B12  Amenorrhea - Plan: POCT urine pregnancy   I ordered a pneumovax 23, but my CMA thought I had meant prevnar 13 and gave this shot instead.  We called the patient and explained the error.  This immunization will not be dangerous to her, and we can give her a pneumovax in one year if she likes.  This error was reported to our nurse manager and safety zone portal completed.  Await  pap and other lab results Check A1c, but in the meantime will start her on metformin 500, build up to BID Restart wellbutrin for anxiety and depression She has been on both klonopin BID and rare xanax for panic; Refilled klonopin that she can take BID if needed Also gave a small supply of xanax to use if needed for acute panic.  Cautioned her not to ever use xanax and klonpin in the same time period due to risk of excess sedation.  Also do not combine with alcohol Per [place], she filled #20 xanax on 5/18, also filled klonopin at that time and then again  8/1 At the end of her visit she also asked her to refill her adderall which I have not rx in the past.  This is also not on [facility].  Explained to the pt that we will need to discuss this and get her old records, I cannot refill at the last moment.  She understands  Results for orders placed or performed in visit on 01/26/16  Urine Microalbumin w/creat. ratio  Result Value Ref Range   Microalb, Ur 4.1 (H) 0.0 - 1.9 mg/dL   Creatinine,U 210.4 mg/dL   Microalb Creat Ratio 1.9 0.0 - 30.0 mg/g  Basic metabolic panel  Result Value Ref Range   Sodium 135 135 - 145 mEq/L   Potassium 3.6 3.5 - 5.1 mEq/L   Chloride 102 96 - 112 mEq/L   CO2 25 19 - 32 mEq/L   Glucose, Bld 144 (H) 70 - 99 mg/dL   BUN 7 6 - 23 mg/dL   Creatinine, Ser 0.74 0.40 - 1.20 mg/dL   Calcium 8.6 8.4 - 10.5 mg/dL   GFR 90.31 >60.00 mL/min  Hemoglobin A1c  Result Value Ref Range   Hgb A1c MFr Bld 7.3 (H) 4.6 - 6.5 %  B12  Result Value Ref Range   Vitamin B-12 302 211 - 911 pg/mL  Lipid panel  Result Value Ref Range   Cholesterol 222 (H) 0 - 200 mg/dL   Triglycerides 120.0 0.0 - 149.0 mg/dL   HDL 32.50 (L) >39.00 mg/dL   VLDL 24.0 0.0 - 40.0 mg/dL   LDL Cholesterol 165 (H) 0 - 99 mg/dL   Total CHOL/HDL Ratio 7    NonHDL 189.20   POCT urine pregnancy  Result Value Ref Range   Preg Test, Ur Negative Negative    Signed Lamar Blinks, MD

## 2016-01-27 LAB — CYTOLOGY - PAP

## 2016-01-29 ENCOUNTER — Telehealth: Payer: Self-pay | Admitting: Family Medicine

## 2016-01-29 NOTE — Telephone Encounter (Signed)
Called her to go over her labs and discuss her ADHD medication.  No answer.  LMOM- will try her again later.  Results for orders placed or performed in visit on 01/26/16  Urine Microalbumin w/creat. ratio  Result Value Ref Range   Microalb, Ur 4.1 (H) 0.0 - 1.9 mg/dL   Creatinine,U 210.4 mg/dL   Microalb Creat Ratio 1.9 0.0 - 30.0 mg/g  Basic metabolic panel  Result Value Ref Range   Sodium 135 135 - 145 mEq/L   Potassium 3.6 3.5 - 5.1 mEq/L   Chloride 102 96 - 112 mEq/L   CO2 25 19 - 32 mEq/L   Glucose, Bld 144 (H) 70 - 99 mg/dL   BUN 7 6 - 23 mg/dL   Creatinine, Ser 0.74 0.40 - 1.20 mg/dL   Calcium 8.6 8.4 - 10.5 mg/dL   GFR 90.31 >60.00 mL/min  Hemoglobin A1c  Result Value Ref Range   Hgb A1c MFr Bld 7.3 (H) 4.6 - 6.5 %  B12  Result Value Ref Range   Vitamin B-12 302 211 - 911 pg/mL  Lipid panel  Result Value Ref Range   Cholesterol 222 (H) 0 - 200 mg/dL   Triglycerides 120.0 0.0 - 149.0 mg/dL   HDL 32.50 (L) >39.00 mg/dL   VLDL 24.0 0.0 - 40.0 mg/dL   LDL Cholesterol 165 (H) 0 - 99 mg/dL   Total CHOL/HDL Ratio 7    NonHDL 189.20   POCT urine pregnancy  Result Value Ref Range   Preg Test, Ur Negative Negative  Cytology - PAP  Result Value Ref Range   CYTOLOGY - PAP PAP RESULT

## 2016-01-30 NOTE — Telephone Encounter (Signed)
Called again, no answer

## 2016-02-01 NOTE — Telephone Encounter (Signed)
Called her and LMOM again

## 2016-02-08 NOTE — Telephone Encounter (Signed)
Called again- no answer.  Will go ahead and send a lab letter to her.  LMOM

## 2016-02-17 ENCOUNTER — Telehealth: Payer: Self-pay | Admitting: Family Medicine

## 2016-02-17 DIAGNOSIS — E785 Hyperlipidemia, unspecified: Secondary | ICD-10-CM

## 2016-02-17 NOTE — Telephone Encounter (Signed)
Pt says that she received letter from provider. She would like to have cholesterol medication sent to pharmacy:   [facility].

## 2016-02-19 MED ORDER — LOVASTATIN 20 MG PO TABS: 20.0000 mg | ORAL_TABLET | Freq: Every day | ORAL | 3 refills | Status: DC

## 2016-03-22 ENCOUNTER — Ambulatory Visit: Payer: 59 | Admitting: Family Medicine

## 2016-04-04 ENCOUNTER — Other Ambulatory Visit: Payer: Self-pay | Admitting: Family Medicine

## 2016-04-04 ENCOUNTER — Encounter: Payer: Self-pay | Admitting: Family Medicine

## 2016-04-04 NOTE — Telephone Encounter (Signed)
Received a refill request for cyclobenzaprine (FLEXERIL) 10 MG. Last office visit 01/26/16, no date listed for last refill. Is it ok to refill? Please advise.

## 2016-04-05 ENCOUNTER — Ambulatory Visit (INDEPENDENT_AMBULATORY_CARE_PROVIDER_SITE_OTHER): Payer: 59 | Admitting: Family Medicine

## 2016-04-05 ENCOUNTER — Encounter: Payer: Self-pay | Admitting: Family Medicine

## 2016-04-05 VITALS — BP 138/89 | HR 97 | Temp 98.6°F | Ht 61.0 in | Wt 178.6 lb

## 2016-04-05 DIAGNOSIS — F5101 Primary insomnia: Secondary | ICD-10-CM | POA: Diagnosis not present

## 2016-04-05 DIAGNOSIS — F988 Other specified behavioral and emotional disorders with onset usually occurring in childhood and adolescence: Secondary | ICD-10-CM

## 2016-04-05 DIAGNOSIS — IMO0001 Reserved for inherently not codable concepts without codable children: Secondary | ICD-10-CM

## 2016-04-05 DIAGNOSIS — F341 Dysthymic disorder: Secondary | ICD-10-CM | POA: Diagnosis not present

## 2016-04-05 DIAGNOSIS — F410 Panic disorder [episodic paroxysmal anxiety] without agoraphobia: Secondary | ICD-10-CM | POA: Diagnosis not present

## 2016-04-05 DIAGNOSIS — E1165 Type 2 diabetes mellitus with hyperglycemia: Secondary | ICD-10-CM | POA: Diagnosis not present

## 2016-04-05 DIAGNOSIS — R5382 Chronic fatigue, unspecified: Secondary | ICD-10-CM

## 2016-04-05 MED ORDER — AMPHETAMINE-DEXTROAMPHETAMINE 10 MG PO TABS: 10.0000 mg | ORAL_TABLET | Freq: Two times a day (BID) | ORAL | 0 refills | Status: DC

## 2016-04-05 MED ORDER — BUPROPION HCL ER (SR) 150 MG PO TB12: 150.0000 mg | ORAL_TABLET | Freq: Two times a day (BID) | ORAL | 5 refills | Status: DC

## 2016-04-05 NOTE — Patient Instructions (Signed)
Great job with weight loss!  We will check your A1c today but I suspect it will be improved Please increase your Wellbutrin to 2 doses a day; be aware of possible interaction between wellbutrin and adderall, please let me know if any unexpected symptoms develop I will also rx your adderall for you for this month and next month Please come and see me for a FASTING visit in about 3 months

## 2016-04-05 NOTE — Progress Notes (Signed)
Pre visit review using our clinic review tool, if applicable. No additional management support is needed unless otherwise documented below in the visit note. 

## 2016-04-05 NOTE — Progress Notes (Signed)
[place] at [facility] [address], [place], [place] [address] [phone] [id number] [phone]  Date:  04/05/2016   Name:  Sherri Cross   DOB:  [birth year]   MRN:  [id number]  PCP:  Lamar Blinks, MD    Chief Complaint: No chief complaint on file.   History of Present Illness:  Sherri Cross is a 45 y.o. very pleasant female patient who presents with the following:  Here today for a recheck visit- last seen her in August  Here today for a CPE.  Dx with DM at her last visit with A1c of 7.9%.   She has not followed up until now as she was in [place] Last pap about 2 years ago, never had an abnl.   She had to spend 2 months in [place] during the summer; her parents are living there right now.  She notes that she still has a lot of family stressors. She has seen psychiatry in the past but never seemed to have a good experience there.  She does not wish to see psychiatry again; reports that she has been seen by 4 different psychiatrists in the past and that she had a better experience with primary care taking care of her mental health  LMP: she takes her OCP continuously She is on OCP; she reports a history of PCOS and endometriosis She is on an OCP called "ovelle" that she just buys OTC in [place]  She endorses a history of depresion and anxiety.  However she strongly denies any change of SI or HI. She has done well with wellbutrin in the past and would like to go back on this.  She has taken it just once a day before and plans to use it this way again She is fasting today for labs  She has been trying to get her weight under control and has lost some She would like to have a pap today Last mammo was 1-2 years ago  Here today for a recheck visit She is tolerating her metformin well- she did have some GI sx for about one week but these resolved  Wt Readings from Last 3 Encounters:  04/05/16 178 lb 9.6 oz (81 kg)  01/26/16 184 lb 3.2 oz (83.6  kg)  10/21/15 196 lb 3.2 oz (89 kg)   She has lost more weight- she is down nearly 20 lbs since May and is pleased with this.   She is still under a lot of stress from her family- she notes that she will occasionally feel down., weepy, and amotivated.  She would like to increase her wellbutrin.   She has been on 300 mg in the past and tolerated this well- she took it in combo with adderall and never had any adverse effects "I have not slept good in 15 years," she used klonpin for this as needed and it does help her. She feels that anxiety keeps her awake at night. She also notes that she suffers from chronic fatigue, that she is unable to work due to her low energy and body aches No SI or HI  She is taking her vitamin B12 when she remembers.   Her lipids were not ideal at last visit, but she wanted to try a trial of lifestyle change prior to medication Lab Results  Component Value Date   HGBA1C 7.3 (H) 01/26/2016   She states that "I have know I had ADD since I was a teenager.  With adderall I am able to get things done- without it I get confused and I go sit down." This is rx by Debbe Bales; pt states she is taking it regularly a this time.  She would like for me to rx this med for convenince.  She may take it BID but does not always take it if she is not busy. She has been taking this for 2-3 years.  No complications noted   Reviewed NCCSR- no unexpected or outside entries, but no record of adderall is present either  Patient Active Problem List   Diagnosis Date Noted  . Diabetes mellitus type 2, uncontrolled, without complications ([place]) 0000000  . Insomnia 10/21/2015  . Anxiety disorder, unspecified 10/21/2015  . Hypothyroidism due to acquired atrophy of thyroid 10/21/2015  . Panic attacks 10/21/2015    Past Medical History:  Diagnosis Date  . Anemia   . Panic attacks 10/21/2015  . Thyroid disease     Past Surgical History:  Procedure Laterality Date  . Enometrial    .  POLYPECTOMY     Uterus    Social History  Substance Use Topics  . Smoking status: Never Smoker  . Smokeless tobacco: Not on file  . Alcohol use No    No family history on file.  No Known Allergies  Medication list has been reviewed and updated.  Current Outpatient Prescriptions on File Prior to Visit  Medication Sig Dispense Refill  . ALPRAZolam (XANAX) 1 MG tablet Take 1/2 or 1 daily as needed for panic attacks 20 tablet 0  . amphetamine-dextroamphetamine (ADDERALL) 10 MG tablet Take 10 mg by mouth daily.    Marland Kitchen buPROPion (WELLBUTRIN SR) 150 MG 12 hr tablet Take 1 tablet (150 mg total) by mouth daily. 30 tablet 5  . clonazePAM (KLONOPIN) 1 MG tablet Take 1 tablet (1 mg total) by mouth 2 (two) times daily as needed for anxiety. 60 tablet 1  . cyclobenzaprine (FLEXERIL) 10 MG tablet Take 10 mg by mouth 3 (three) times daily as needed for muscle spasms.    Marland Kitchen levonorgestrel-ethinyl estradiol (AVIANE,ALESSE,LESSINA) 0.1-20 MG-MCG tablet Take 1 tablet by mouth daily. Pt gets in [place] OTC 1 Package 11  . lovastatin (MEVACOR) 20 MG tablet Take 1 tablet (20 mg total) by mouth at bedtime. 90 tablet 3  . metFORMIN (GLUCOPHAGE) 500 MG tablet Take 1 tablet (500 mg total) by mouth 2 (two) times daily with a meal. 60 tablet 5  . thyroid (ARMOUR THYROID) 30 MG tablet Take 1 tablet (30 mg total) by mouth daily before breakfast. 90 tablet 3   No current facility-administered medications on file prior to visit.     Review of Systems:  As per HPI- otherwise negative.   Physical Examination: Blood pressure 138/89, pulse 97, temperature 98.6 F (37 C), temperature source Oral, height 5\' 1"  (1.549 m), weight 178 lb 9.6 oz (81 kg), SpO2 98 %. Body mass index is 33.75 kg/m.  Ideal Body Weight:    GEN: WDWN, NAD, Non-toxic, A & O x 3, obese, looks well HEENT: Atraumatic, Normocephalic. Neck supple. No masses, No LAD.  Bilateral TM wnl, oropharynx normal.  PEERL,EOMI.   Ears and Nose: No external  deformity. CV: RRR, No M/G/R. No JVD. No thrill. No extra heart sounds. PULM: CTA B, no wheezes, crackles, rhonchi. No retractions. No resp. distress. No accessory muscle use. EXTR: No c/c/e NEURO Normal gait.  PSYCH: Normally interactive. Conversant. Not depressed or anxious appearing.  Calm demeanor.    Assessment  and Plan: Uncontrolled type 2 diabetes mellitus without complication, without long-term current use of insulin ([place]) - Plan: Hemoglobin A1C  Panic attacks - Plan: buPROPion (WELLBUTRIN SR) 150 MG 12 hr tablet  Primary insomnia  Dysthymia - Plan: buPROPion (WELLBUTRIN SR) 150 MG 12 hr tablet  Attention deficit disorder (ADD) without hyperactivity - Plan: amphetamine-dextroamphetamine (ADDERALL) 10 MG tablet, DISCONTINUED: amphetamine-dextroamphetamine (ADDERALL) 10 MG tablet  Chronic fatigue - Plan: amphetamine-dextroamphetamine (ADDERALL) 10 MG tablet, DISCONTINUED: amphetamine-dextroamphetamine (ADDERALL) 10 MG tablet  Did controlled substance contract for her today  [place] job with weight loss!  We will check your A1c today but I suspect it will be improved Please increase your Wellbutrin to 2 doses a day; be aware of possible interaction between wellbutrin and adderall, please let me know if any unexpected symptoms develop I will also rx your adderall for you for this month and next month Please come and see me for a FASTING visit in about 3 months   Meds ordered this encounter  Medications  . buPROPion (WELLBUTRIN SR) 150 MG 12 hr tablet    Sig: Take 1 tablet (150 mg total) by mouth 2 (two) times daily.    Dispense:  60 tablet    Refill:  5  . DISCONTD: amphetamine-dextroamphetamine (ADDERALL) 10 MG tablet    Sig: Take 1 tablet (10 mg total) by mouth 2 (two) times daily with a meal.    Dispense:  60 tablet    Refill:  0  . amphetamine-dextroamphetamine (ADDERALL) 10 MG tablet    Sig: Take 1 tablet (10 mg total) by mouth 2 (two) times daily with a meal. Ok to fill  30 days after rx    Dispense:  60 tablet    Refill:  0     Signed Lamar Blinks, MD

## 2016-04-06 ENCOUNTER — Telehealth: Payer: Self-pay

## 2016-04-06 LAB — HEMOGLOBIN A1C: HEMOGLOBIN A1C: 6.7 % — AB (ref 4.6–6.5)

## 2016-04-06 NOTE — Telephone Encounter (Signed)
PA initiated via Covermymeds; KEY: [id number]. Awaiting determination.

## 2016-04-11 ENCOUNTER — Telehealth: Payer: Self-pay | Admitting: Family Medicine

## 2016-04-11 NOTE — Telephone Encounter (Signed)
Received records from [place] and [place], Sherri Cross.  These notes do document that she has been treated with adderall (20 BID) for dx of ADD, primarily inattentive type, as far back as 2014.  Most recent note from 10/2014 has her taking clonazepam, adderall, wellbutrin 150 BID, zoloft

## 2016-04-14 NOTE — Telephone Encounter (Signed)
Patient is calling to follow up on this PA

## 2016-04-19 NOTE — Telephone Encounter (Signed)
Received PA denial. Medication denied because medication require that Pt has one of the following:  (i)Symptoms have been present prior to 45 years of age;  AND (ii)Symptoms must interfere with or reduce the quality of academic or occupational functioning; OR medication must be written by or in consultation with a mental health specialist.   Appeal information provided. Appeal must be a letter from provider with why the medication is medically necessary and mailed to:  [employer] Coordinator [id number] [place] [address]  Or faxed: [phone]  Please advise.

## 2016-04-24 ENCOUNTER — Encounter: Payer: Self-pay | Admitting: Family Medicine

## 2016-04-24 NOTE — Telephone Encounter (Signed)
°  Call back number: [phone] PA department   Reason for call:  Just a FYI regarding message below.

## 2016-04-24 NOTE — Telephone Encounter (Signed)
Wrote letter and will fax for her

## 2016-04-25 NOTE — Telephone Encounter (Signed)
Letter faxed.

## 2016-05-05 ENCOUNTER — Other Ambulatory Visit: Payer: Self-pay | Admitting: Family Medicine

## 2016-05-05 DIAGNOSIS — G4700 Insomnia, unspecified: Secondary | ICD-10-CM

## 2016-05-05 DIAGNOSIS — F410 Panic disorder [episodic paroxysmal anxiety] without agoraphobia: Secondary | ICD-10-CM

## 2016-05-05 DIAGNOSIS — F419 Anxiety disorder, unspecified: Secondary | ICD-10-CM

## 2016-05-08 ENCOUNTER — Other Ambulatory Visit: Payer: Self-pay | Admitting: Emergency Medicine

## 2016-05-08 ENCOUNTER — Encounter: Payer: Self-pay | Admitting: Family Medicine

## 2016-05-08 NOTE — Telephone Encounter (Signed)
Xanax and clonazepam are needing to be refilled patient says [facility] [address] called Friday patient would like to talk about the adderrall as well please call her. Insurance covered but it is too expensive she would like to discuss her options     Her number [phone]

## 2016-06-25 ENCOUNTER — Other Ambulatory Visit: Payer: Self-pay | Admitting: Family Medicine

## 2016-06-26 ENCOUNTER — Telehealth: Payer: Self-pay | Admitting: Emergency Medicine

## 2016-06-26 DIAGNOSIS — E034 Atrophy of thyroid (acquired): Secondary | ICD-10-CM

## 2016-06-26 MED ORDER — THYROID 30 MG PO TABS: 30.0000 mg | ORAL_TABLET | Freq: Every day | ORAL | 3 refills | Status: DC

## 2016-06-28 NOTE — Telephone Encounter (Addendum)
Relation to [id number] Call back number:[phone] [id number] [place] [place]  Reason for call:  Patient states medication refill should reflect 90 MG of thyroid (ARMOUR THYROID) instead of 30 MG and a 90 day supply, patient would like to speak with nurse directly

## 2016-06-29 ENCOUNTER — Other Ambulatory Visit: Payer: Self-pay | Admitting: Emergency Medicine

## 2016-06-29 ENCOUNTER — Encounter: Payer: Self-pay | Admitting: Family Medicine

## 2016-06-29 DIAGNOSIS — E034 Atrophy of thyroid (acquired): Secondary | ICD-10-CM

## 2016-06-29 MED ORDER — THYROID 90 MG PO TABS: 90.0000 mg | ORAL_TABLET | Freq: Every day | ORAL | 3 refills | Status: DC

## 2016-06-29 NOTE — Telephone Encounter (Signed)
Spoke to pt who states that 30 mg thyroid tablets should actually be 90 mg tablets with 30 day supply instead of 90. Sent to provider to confirm if this change is ok to make before sending refill.

## 2016-06-29 NOTE — Telephone Encounter (Signed)
Pt called in regarding refill on thyroid (ARMOUR THYROID) tablet. Pt states that thyroid med should actually be 90mg  instead of 30mg . Is it ok to make this change? Please advise.

## 2016-07-06 ENCOUNTER — Ambulatory Visit (INDEPENDENT_AMBULATORY_CARE_PROVIDER_SITE_OTHER): Payer: 59 | Admitting: Family Medicine

## 2016-07-06 VITALS — BP 144/93 | HR 95 | Temp 98.1°F | Ht 61.0 in | Wt 186.0 lb

## 2016-07-06 DIAGNOSIS — E785 Hyperlipidemia, unspecified: Secondary | ICD-10-CM

## 2016-07-06 DIAGNOSIS — Z818 Family history of other mental and behavioral disorders: Secondary | ICD-10-CM

## 2016-07-06 DIAGNOSIS — E034 Atrophy of thyroid (acquired): Secondary | ICD-10-CM | POA: Diagnosis not present

## 2016-07-06 DIAGNOSIS — Z23 Encounter for immunization: Secondary | ICD-10-CM | POA: Diagnosis not present

## 2016-07-06 DIAGNOSIS — F341 Dysthymic disorder: Secondary | ICD-10-CM

## 2016-07-06 DIAGNOSIS — IMO0001 Reserved for inherently not codable concepts without codable children: Secondary | ICD-10-CM

## 2016-07-06 DIAGNOSIS — E1165 Type 2 diabetes mellitus with hyperglycemia: Secondary | ICD-10-CM | POA: Diagnosis not present

## 2016-07-06 DIAGNOSIS — Z72820 Sleep deprivation: Secondary | ICD-10-CM

## 2016-07-06 LAB — LIPID PANEL
CHOL/HDL RATIO: 5
Cholesterol: 175 mg/dL (ref 0–200)
HDL: 37.3 mg/dL — ABNORMAL LOW (ref >39.00)
LDL CALC: 105 mg/dL — AB (ref 0–99)
NONHDL: 138.07
Triglycerides: 164 mg/dL — ABNORMAL HIGH (ref 0.0–149.0)
VLDL: 32.8 mg/dL (ref 0.0–40.0)

## 2016-07-06 LAB — BASIC METABOLIC PANEL
BUN: 9 mg/dL (ref 6–23)
CHLORIDE: 101 meq/L (ref 96–112)
CO2: 26 meq/L (ref 19–32)
Calcium: 9.5 mg/dL (ref 8.4–10.5)
Creatinine, Ser: 0.81 mg/dL (ref 0.40–1.20)
GFR: 81.2 mL/min (ref >60.00)
Glucose, Bld: 163 mg/dL — ABNORMAL HIGH (ref 70–99)
POTASSIUM: 3.7 meq/L (ref 3.5–5.1)
Sodium: 135 mEq/L (ref 135–145)

## 2016-07-06 LAB — HEMOGLOBIN A1C: HEMOGLOBIN A1C: 6.9 % — AB (ref 4.6–6.5)

## 2016-07-06 LAB — TSH: TSH: 2.15 u[IU]/mL (ref 0.35–4.50)

## 2016-07-06 MED ORDER — CYCLOBENZAPRINE HCL 10 MG PO TABS: 10.0000 mg | ORAL_TABLET | Freq: Three times a day (TID) | ORAL | 0 refills | Status: DC | PRN

## 2016-07-06 NOTE — Progress Notes (Signed)
Pre visit review using our clinic review tool, if applicable. No additional management support is needed unless otherwise documented below in the visit note. 

## 2016-07-06 NOTE — Patient Instructions (Addendum)
It was nice to see you today- I hope that things get better for you You can take both of your metformin pills in the morning if that is easier to tolerate We will see how your blood levels look today and I will be in touch with you- we can then plan the next step  Please do get an eye exam soon We will set you up for a sleep study Please contact the [place] behavorial health dept regarding ADD testing

## 2016-07-06 NOTE — Progress Notes (Signed)
[place] at [facility] [address] [place], [place] [address] [id number] [phone]  Date:  07/06/2016   Name:  Sherri Cross   DOB:  02/08/1971   MRN:  [id number]  PCP:  Lamar Blinks, MD    Chief Complaint: Follow-up (Pt here for fasting 3 month follow up. Pt states that she does not want to have her labs done today. Flu vaccine 01/2016.)   History of Present Illness:  Meleah P Heckart is a 46 y.o. very pleasant female patient who presents with the following:  Here today for a follow-up visit History of DM 2, hypothyroidism, anxiety, overweight Last seen in November with the following HPI:  She is tolerating her metformin well- she did have some GI sx for about one week but these resolved     Wt Readings from Last 3 Encounters:  04/05/16 178 lb 9.6 oz (81 kg)  01/26/16 184 lb 3.2 oz (83.6 kg)  10/21/15 196 lb 3.2 oz (89 kg)   She has lost more weight- she is down nearly 20 lbs since May and is pleased with this.   She is still under a lot of stress from her family- she notes that she will occasionally feel down., weepy, and amotivated.  She would like to increase her wellbutrin.   She has been on 300 mg in the past and tolerated this well- she took it in combo with adderall and never had any adverse effects "I have not slept good in 15 years," she used klonpin for this as needed and it does help her. She feels that anxiety keeps her awake at night. She also notes that she suffers from chronic fatigue, that she is unable to work due to her low energy and body aches No SI or HI  She is taking her vitamin B12 when she remembers.   Her lipids were not ideal at last visit, but she wanted to try a trial of lifestyle change prior to medication Recent Labs       Lab Results  Component Value Date   HGBA1C 7.3 (H) 01/26/2016     She states that "I have know I had ADD since I was a teenager.  With adderall I am able to get  things done- without it I get confused and I go sit down." This is rx by Debbe Bales; pt states she is taking it regularly a this time.  She would like for me to rx this med for convenince.  She may take it BID but does not always take it if she is not busy. She has been taking this for 2-3 years.  No complications noted   Reviewed NCCSR- no unexpected or outside entries, but no record of adderall is present either  Lab Results  Component Value Date   HGBA1C 6.7 (H) 04/05/2016   Her A1c did show improvement as above at last visit She needs a foot exam today Eye exam: last exam about one year ago Pneumonia vaccine: she had the prevnar by mistake last year.  Not yet enough time for a pneumovax   Today she reports that she has been under a lot of stress. " I've had a bad 3 months, I have not eaten well. The labs will just come back bad. I would prefer to wait another 3 months."  She also noted that when she takes her evening dose of metformin it will cause GERD- she does fine when she takes  it in the am.   She just started seeing a new mental health professional- saw her yesterday, NP Rollene Fare Mozino? They got along well and she is positive about this new professional relationship [name] continues to be very negative about most aspects of her life, and states that all of her problems have been due to "other Indians.  I just stay away from the whole race."  Brought up the idea of counseling with her- she does not think that this would help because "the problem is not with me, the problem is with everyone else.  If everyone around me would be better I would be better too."   She feels that she is ready to leave her marriage- which she states has been problematic since the start- however her husband has threatened to kill himself if she leaves   Per Aggie Cosier needs formal ADD testing- will help her get this arranged Also has complaint of poor sleep for years- would like a sleep study which is certainly  fine She also needs a refill of her flexeril that she uses as needed for muscle spasms   She has gained back much of her weight that she had lost recently   Wt Readings from Last 3 Encounters:  07/06/16 186 lb (84.4 kg)  04/05/16 178 lb 9.6 oz (81 kg)  01/26/16 184 lb 3.2 oz (83.6 kg)   Lab Results  Component Value Date   TSH 1.04 10/21/2015   She is due for a thyroid, A1c and lipids today- she is willing to have these labs done when I explained that the information will still be useful to [place] in managing her health Her last tetanus shot was over 10 years ago- she does not know if she had tdap or td in the past.  Would like to update this today  Patient Active Problem List   Diagnosis Date Noted  . Diabetes mellitus type 2, uncontrolled, without complications ([place]) 0000000  . Insomnia 10/21/2015  . Anxiety disorder, unspecified 10/21/2015  . Hypothyroidism due to acquired atrophy of thyroid 10/21/2015  . Panic attacks 10/21/2015    Past Medical History:  Diagnosis Date  . Anemia   . Panic attacks 10/21/2015  . Thyroid disease     Past Surgical History:  Procedure Laterality Date  . Enometrial    . POLYPECTOMY     Uterus    Social History  Substance Use Topics  . Smoking status: Never Smoker  . Smokeless tobacco: Not on file  . Alcohol use No    No family history on file.  No Known Allergies  Medication list has been reviewed and updated.  Current Outpatient Prescriptions on File Prior to Visit  Medication Sig Dispense Refill  . ALPRAZolam (XANAX) 1 MG tablet Take 1/2 or 1 tablet daily if needed for panic attack.  Do not combine with klonpin 20 tablet 0  . amphetamine-dextroamphetamine (ADDERALL) 10 MG tablet Take 1 tablet (10 mg total) by mouth 2 (two) times daily with a meal. Ok to fill 30 days after rx 60 tablet 0  . buPROPion (WELLBUTRIN SR) 150 MG 12 hr tablet Take 1 tablet (150 mg total) by mouth 2 (two) times daily. 60 tablet 5  . clonazePAM (KLONOPIN)  1 MG tablet TAKE 1 TABLET BY MOUTH 2 TIMES DAILY AS NEEDED FOR ANXIETY. 60 tablet 1  . cyclobenzaprine (FLEXERIL) 10 MG tablet Take 10 mg by mouth 3 (three) times daily as needed for muscle spasms.    Marland Kitchen  levonorgestrel-ethinyl estradiol (AVIANE,ALESSE,LESSINA) 0.1-20 MG-MCG tablet Take 1 tablet by mouth daily. Pt gets in [place] OTC 1 Package 11  . lovastatin (MEVACOR) 20 MG tablet Take 1 tablet (20 mg total) by mouth at bedtime. 90 tablet 3  . metFORMIN (GLUCOPHAGE) 500 MG tablet Take 1 tablet (500 mg total) by mouth 2 (two) times daily with a meal. 60 tablet 5  . thyroid (ARMOUR) 90 MG tablet Take 1 tablet (90 mg total) by mouth daily before breakfast. 90 tablet 3   No current facility-administered medications on file prior to visit.     Review of Systems:  As per HPI- otherwise negative.   Physical Examination: Vitals:   07/06/16 0900  BP: (!) 144/93  Pulse: 95  Temp: 98.1 F (36.7 C)   Vitals:   07/06/16 0900  Weight: 186 lb (84.4 kg)  Height: 5\' 1"  (1.549 m)   Body mass index is 35.14 kg/m. Ideal Body Weight: Weight in (lb) to have BMI = 25: 132  GEN: WDWN, NAD, Non-toxic, A & O x 3, overweight, looks well HEENT: Atraumatic, Normocephalic. Neck supple. No masses, No LAD. Bilateral TM wnl, oropharynx normal.  PEERL,EOMI.   Ears and Nose: No external deformity. CV: RRR, No M/G/R. No JVD. No thrill. No extra heart sounds. PULM: CTA B, no wheezes, crackles, rhonchi. No retractions. No resp. distress. No accessory muscle use. ABD: S, NT, ND EXTR: No c/c/e NEURO Normal gait.  PSYCH: Normally interactive. Conversant. Not depressed or anxious appearing.  Calm demeanor.  Normal foot exam bilaterally    Assessment and Plan: Uncontrolled type 2 diabetes mellitus without complication, without long-term current use of insulin ([place]) - Plan: Basic metabolic panel, Hemoglobin A1c  Immunization due - Plan: Tdap vaccine greater than or equal to 7yo IM  Hypothyroidism due to acquired  atrophy of thyroid - Plan: TSH  Dysthymia  Dyslipidemia - Plan: Lipid panel  Family history of problems related to stress  Poor sleep - Plan: Ambulatory referral to Neurology  Here today for a follow-up visit A1c pending- suspect her number may be worse as she has re-gained some weight tdap today Lipids pending TSH pending Given pamphlet to contact [place] ADD testing Neurology referral for sleep study Tried to encourage her to explore counseling again and the ways that changing her reactions to others might help her. But she is not very open to this idea currently    Signed Lamar Blinks, MD

## 2016-08-10 ENCOUNTER — Institutional Professional Consult (permissible substitution): Payer: 59 | Admitting: Neurology

## 2016-08-10 ENCOUNTER — Ambulatory Visit (INDEPENDENT_AMBULATORY_CARE_PROVIDER_SITE_OTHER): Payer: 59 | Admitting: Neurology

## 2016-08-10 ENCOUNTER — Encounter: Payer: Self-pay | Admitting: Neurology

## 2016-08-10 VITALS — BP 144/92 | HR 112 | Resp 20 | Ht 61.5 in | Wt 185.0 lb

## 2016-08-10 DIAGNOSIS — M797 Fibromyalgia: Secondary | ICD-10-CM | POA: Diagnosis not present

## 2016-08-10 DIAGNOSIS — R5382 Chronic fatigue, unspecified: Secondary | ICD-10-CM | POA: Diagnosis not present

## 2016-08-10 DIAGNOSIS — F419 Anxiety disorder, unspecified: Secondary | ICD-10-CM

## 2016-08-10 DIAGNOSIS — F5104 Psychophysiologic insomnia: Secondary | ICD-10-CM | POA: Diagnosis not present

## 2016-08-10 DIAGNOSIS — F458 Other somatoform disorders: Secondary | ICD-10-CM | POA: Diagnosis not present

## 2016-08-10 DIAGNOSIS — G9332 Myalgic encephalomyelitis/chronic fatigue syndrome: Secondary | ICD-10-CM

## 2016-08-10 HISTORY — DX: Myalgic encephalomyelitis/chronic fatigue syndrome: G93.32

## 2016-08-10 HISTORY — DX: Fibromyalgia: M79.7

## 2016-08-10 NOTE — Progress Notes (Signed)
SLEEP MEDICINE CLINIC   Provider:  Larey Cross, M D  Referring Provider: Kelby Aline, Sherri   Chief Complaint  Patient presents with  . New Patient (Initial Visit)    snores, never had a sleep study  Chief complaint according to patient : " I cannot sleep, cannot stay asleep '   HPI:  Sherri Cross is a 46 y.o. 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 She had tried multiple sleep aids with her previous primary care physician and as she recently had changed providers brought a bag of those to demonstrate her problem to the new appointment with a new provider Sherri Sherri Cross, It was Sherri Cross who recommended a sleep evaluation.  She was rightfully concerned that the patient had over 15 years not found lasting relief from insomnia with any of the medications. She did not recall all the  sleep aids that have failed to provide relief.  She has tried Ambien, Lunesta, Quarry manager, Xanax, Klonopin for over 7 years, She shares a bedroom with her husband and 2 cats which sleep also in the bed. She states that she does not snore, her husband has never reported any apneas, she's not restlessly moving , kicking, yelling.   The patient is also the process of losing weight over the last year had lost about 20 pounds unintentionally and related to stress. She reports that she lost her appetite, developed back pain, felt unmotivated. She has on and off sciatica. She has degenerative disc disease between L3 and L4 as well as L5 and S1. She states that because of her sleep problem and stress she is unable to work outside the home. She has hypothyroidism, history of diabetes type 2 and has been treated with metformin, she has also a history of morbid obesity, anxiety, depression, and has taken  Wellbutrin and Adderall, Zoloft was tried before, Lexapro, and Paxil. She was diagnosed with ADD since she was a teenager, and  she has followed Sherri Cross.   Sleep habits are as follows: Her usual bedtime is between 9 at 9:30 and this is a long-time entrained pattern. The bedroom is cool, Quiet and dark, she wears ear plugs as her husband is a loud snorer. She also wears a sleep mask. She usually sleeps on her side, but she turns on her back later during the night- she repeatedly explains the degree of back pain and that she is unable to be gainfully employed.  After taking Klonopin at night she will sleep within 25 minutes. She will stay asleep at the most for 4-1/2 hours. At that time the medication has worn off and she is wide awake but not rested or restored. She is unable to return to sleep. Even after trying additional doses of Klonopin, she could not go back to sleep. This fragmented sleep pattern is actually improved over the time before Klonopin was initiated.   Sleep medical history and family sleep history:  She does not have a family history of sleep apnea, her husband has it using a CPAP machine. He did not undergo tonsillectomy, there is no history of ENT surgeries. She has had endometriosis treatments for fibroids, and she had DDD.   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 She is married, she does not have children. She has two cats,  She doesn't drink alcohol, she doesn't smoke, no tobacco use another form, caffeine use none( 'My adrenals won't take it " )   Review of Systems: Out of a complete 14 system review, the patient complains of only the following symptoms, and all other reviewed systems are negative. Fatigue, chronic insomnia, worried, seems unhappy. Sherri Cross Kitchen   Epworth score 1 , Fatigue severity score n/a   , depression score n/a    Social History   Social History  . Marital status: Married    Spouse name: N/A  . Number of children: N/A    . Years of education: N/A   Occupational History  . Not on file.   Social History Main Topics  . Smoking status: Never Smoker  . Smokeless tobacco: Current User  . Alcohol use No  . Drug use: No  . Sexual activity: Yes   Other Topics Concern  . Not on file   Social History Narrative  . No narrative on file    No family history on file.  Past Medical History:  Diagnosis Date  . Anemia   . Panic attacks 10/21/2015  . Thyroid disease     Past Surgical History:  Procedure Laterality Date  . Enometrial    . POLYPECTOMY     Uterus    Current Outpatient Prescriptions  Medication Sig Dispense Refill  . ALPRAZolam (XANAX) 1 MG tablet Take 1/2 or 1 tablet daily if needed for panic attack.  Do not combine with klonpin 20 tablet 0  . amphetamine-dextroamphetamine (ADDERALL) 10 MG tablet Take 1 tablet (10 mg total) by mouth 2 (two) times daily with a meal. Ok to fill 30 days after rx 60 tablet 0  . buPROPion (WELLBUTRIN SR) 150 MG 12 hr tablet Take 1 tablet (150 mg total) by mouth 2 (two) times daily. 60 tablet 5  . clonazePAM (KLONOPIN) 1 MG tablet TAKE 1 TABLET BY MOUTH 2 TIMES DAILY AS NEEDED FOR ANXIETY. 60 tablet 1  . cyclobenzaprine (FLEXERIL) 10 MG tablet Take 1 tablet (10 mg total) by mouth 3 (three) times daily as needed for muscle spasms. 30 tablet 0  . levonorgestrel-ethinyl estradiol (AVIANE,ALESSE,LESSINA) 0.1-20 MG-MCG tablet Take 1 tablet by mouth daily. Pt gets in [place] OTC 1 Package 11  . lovastatin (MEVACOR) 20 MG tablet Take 1 tablet (20 mg total) by mouth at bedtime. 90 tablet 3  . metFORMIN (GLUCOPHAGE) 500 MG tablet Take 1 tablet (500 mg total) by mouth 2 (two) times daily with a meal. 60 tablet 5  . thyroid (ARMOUR) 90 MG tablet Take 1 tablet (90 mg total) by mouth daily before breakfast. 90 tablet 3   No current facility-administered medications for this visit.     Allergies as of 08/10/2016  . (No Known Allergies)    Vitals: BP (!) 144/92   Pulse  (!) 112   Resp 20   Ht 5' 1.5" (1.562 m)   Wt 185 lb (83.9 kg)   BMI 34.39 kg/m  Last Weight:  Wt Readings from Last 1 Encounters:  08/10/16 185 lb (83.9 kg)   VHQ:IONG mass index is 34.39 kg/m.     Last Height:   Ht Readings from Last 1 Encounters:  08/10/16 5' 1.5" (1.562 m)    Physical exam:  General: The patient is awake, alert and appears not in acute distress. The patient is well groomed. Head: Normocephalic, atraumatic. Neck is supple. Mallampati  1 , all natural teeth.   neck circumference:14 . Nasal airflow patent  TMJ is  Not  evident . Prognathia is seen.  Cardiovascular:  Regular rate and rhythm, without  murmurs or carotid bruit, and without distended neck veins. Respiratory: Lungs are clear to auscultation. Skin:  Without evidence of edema, or rash Trunk: BMI is  35 . The patient's posture is erect   Neurologic exam : The patient is awake and alert, oriented to place and time.    Cranial nerves: Intact tastte and smell sensation. Pupils are equal and briskly reactive to light. Funduscopic exam without pallor or edema. Extraocular movements  in vertical and horizontal planes intact and without nystagmus. Visual fields by finger perimetry are intact. Hearing to finger rub intact.   Facial motor strength is symmetric and tongue and uvula move midline. Shoulder shrug was symmetrical.   Motor exam:   Normal tone, muscle bulk and symmetric strength in all extremities.  Sensory:  Fine touch, pinprick and vibration , proprioception tested in the upper extremities was normal.  Coordination:  Finger-to-nose maneuver  normal without evidence of ataxia, dysmetria or tremor.  Gait and station: Patient walks without assistive device and is able unassisted to climb up to the exam table. Strength within normal limits.  Stance is stable and normal.    Deep tendon reflexes: in the  upper and lower extremities are symmetric and intact. Babinski maneuver response is  downgoing.  The patient was advised of the nature of the diagnosed sleep disorder , the treatment options and risks for general a health and wellness arising from not treating the condition.  I spent more than 40 minutes of face to face time with the patient. Greater than 50% of time was spent in counseling and coordination of care. We have discussed the diagnosis and differential and I answered the patient's questions.     Assessment:  After physical and neurologic examination, review of laboratory studies,  Personal review of imaging studies, reports of other /same  Imaging studies ,  Results of polysomnography/ neurophysiology testing and pre-existing records as far as provided in visit., my assessment is :   1) Chronic insomia and dependent on Klonopin, which has given her some sleep time. She has failed many sleep aids , reports suffering from Fibromyalgia, chronic fatigue. She suspects to have PTSD.   2) There is no report of any physiological factors.   3) There is no report of  apnea. But patient is morbidly obese, uses benzos and SSRI, and has snored on occasion.  Plan:  Treatment plan and additional workup :  HST for apnea screening. Insomnia needs to be treated by psychologist.    Sherri Seat MD  08/10/2016   CC: Darreld Mclean, [address] [place], [place] [address]

## 2016-08-15 ENCOUNTER — Other Ambulatory Visit: Payer: Self-pay | Admitting: Family Medicine

## 2016-08-15 DIAGNOSIS — E1165 Type 2 diabetes mellitus with hyperglycemia: Principal | ICD-10-CM

## 2016-08-15 DIAGNOSIS — IMO0001 Reserved for inherently not codable concepts without codable children: Secondary | ICD-10-CM

## 2016-09-29 ENCOUNTER — Encounter: Payer: Self-pay | Admitting: Neurology

## 2016-10-19 ENCOUNTER — Ambulatory Visit (INDEPENDENT_AMBULATORY_CARE_PROVIDER_SITE_OTHER): Payer: 59 | Admitting: Family Medicine

## 2016-10-19 ENCOUNTER — Encounter: Payer: Self-pay | Admitting: Family Medicine

## 2016-10-19 VITALS — BP 124/82 | HR 94 | Temp 99.3°F | Ht 61.5 in | Wt 183.4 lb

## 2016-10-19 DIAGNOSIS — R252 Cramp and spasm: Secondary | ICD-10-CM | POA: Diagnosis not present

## 2016-10-19 DIAGNOSIS — E034 Atrophy of thyroid (acquired): Secondary | ICD-10-CM | POA: Diagnosis not present

## 2016-10-19 DIAGNOSIS — F341 Dysthymic disorder: Secondary | ICD-10-CM | POA: Diagnosis not present

## 2016-10-19 DIAGNOSIS — E119 Type 2 diabetes mellitus without complications: Secondary | ICD-10-CM

## 2016-10-19 DIAGNOSIS — E785 Hyperlipidemia, unspecified: Secondary | ICD-10-CM

## 2016-10-19 LAB — COMPREHENSIVE METABOLIC PANEL
ALT: 19 U/L (ref 0–35)
AST: 15 U/L (ref 0–37)
Albumin: 3.9 g/dL (ref 3.5–5.2)
Alkaline Phosphatase: 44 U/L (ref 39–117)
BILIRUBIN TOTAL: 0.4 mg/dL (ref 0.2–1.2)
BUN: 7 mg/dL (ref 6–23)
CALCIUM: 9.3 mg/dL (ref 8.4–10.5)
CO2: 27 meq/L (ref 19–32)
CREATININE: 0.76 mg/dL (ref 0.40–1.20)
Chloride: 102 mEq/L (ref 96–112)
GFR: 87.28 mL/min (ref >60.00)
GLUCOSE: 133 mg/dL — AB (ref 70–99)
Potassium: 4.7 mEq/L (ref 3.5–5.1)
Sodium: 135 mEq/L (ref 135–145)
Total Protein: 7.4 g/dL (ref 6.0–8.3)

## 2016-10-19 LAB — HEMOGLOBIN A1C: HEMOGLOBIN A1C: 7.6 % — AB (ref 4.6–6.5)

## 2016-10-19 MED ORDER — CYCLOBENZAPRINE HCL 10 MG PO TABS: 10.0000 mg | ORAL_TABLET | Freq: Three times a day (TID) | ORAL | 1 refills | Status: DC | PRN

## 2016-10-19 NOTE — Progress Notes (Addendum)
[facility], [address], [place] [address] [phone]  Date:  10/19/2016   Name:  Sherri Cross   DOB:  09-Nov-1970   MRN:  [id number]  PCP:  Darreld Mclean, MD    Chief Complaint: Follow-up (Pt here for f/u visit. )   History of Present Illness:  Sherri Cross is a 46 y.o. very pleasant female patient who presents with the following:  Last seen by myself in February with following partial HPI:  Today she reports that she has been under a lot of stress. " I've had a bad 3 months, I have not eaten well. The labs will just come back bad. I would prefer to wait another 3 months."  She also noted that when she takes her evening dose of metformin it will cause GERD- she does fine when she takes it in the am.   She just started seeing a new mental health professional- saw her yesterday, NP Rollene Fare Mozino? They got along well and she is positive about this new professional relationship [name] continues to be very negative about most aspects of her life, and states that all of her problems have been due to "other Indians.  I just stay away from the whole race."  Brought up the idea of counseling with her- she does not think that this would help because "the problem is not with me, the problem is with everyone else.  If everyone around me would be better I would be better too."   She feels that she is ready to leave her marriage- which she states has been problematic since the start- however her husband has threatened to kill himself if she leaves   Per Aggie Cosier needs formal ADD testing- will help her get this arranged Also has complaint of poor sleep for years- would like a sleep study which is certainly fine She also needs a refill of her flexeril that she uses as needed for muscle spasms   She has gained back much of her weight that she had lost recently   She also saw neurology for a sleep evaluation- Dr.  Brett Fairy felt that her insomnia was psychological. However they are still in the midst of her sleep eval and she is having a sleep study next week.   Pt reports that her insomnia has lasted "more than 15 years just because I'm upset all the time."    She is using adderall, wellbutrin, OCP, lovastatin, metformin, armour thyroid  Today she states that "for months" she notes nausea "all the time," pale stools that float in the toilet water.  She also has loose stools several times a day.  Also she feels like her legs "are tired all the time, I can't stand for a long time as my legs feel so weak and tired."   Apparently she had the same sx last year for a few months that eventually cleared up.  This episode occurred prior to ever starting on metformin,  She is now taking metformin BID for her DM Denies any family history of IBS  No blood in her stool, no dark tarry stools She feels that she is not digesting her food Eating bread seems to help calm her stomach She also notes chronic gas, mostly in the morning She did vomit once, but states that she made herself vomit in order to relieve he stomach acid.    She notes that her  legs feel very tired- would like to check her K today   She is due for an eye exam- will do this summer  She did get the prevnar 13 by mistake last year.    She has a psychiatric NP- Darel Hong, who is managing her mental health medications for her at this time   Wt Readings from Last 3 Encounters:  10/19/16 183 lb 6.4 oz (83.2 kg)  08/10/16 185 lb (83.9 kg)  07/06/16 186 lb (84.4 kg)   Lab Results  Component Value Date   TSH 2.15 07/06/2016   Lab Results  Component Value Date   HGBA1C 6.9 (H) 07/06/2016   Patient Active Problem List   Diagnosis Date Noted  . Chronic fatigue syndrome with fibromyalgia 08/10/2016  . Diabetes mellitus type 2, uncontrolled, without complications ([facility]) 81/44/8185  . Insomnia 10/21/2015  . Anxiety disorder, unspecified  10/21/2015  . Hypothyroidism due to acquired atrophy of thyroid 10/21/2015  . Panic attacks 10/21/2015    Past Medical History:  Diagnosis Date  . Anemia   . Chronic fatigue syndrome with fibromyalgia 08/10/2016  . Panic attacks 10/21/2015  . Thyroid disease     Past Surgical History:  Procedure Laterality Date  . Enometrial    . POLYPECTOMY     Uterus    Social History  Substance Use Topics  . Smoking status: Never Smoker  . Smokeless tobacco: Current User  . Alcohol use No    No family history on file.  No Known Allergies  Medication list has been reviewed and updated.  Current Outpatient Prescriptions on File Prior to Visit  Medication Sig Dispense Refill  . ALPRAZolam (XANAX) 1 MG tablet Take 1/2 or 1 tablet daily if needed for panic attack.  Do not combine with klonpin 20 tablet 0  . amphetamine-dextroamphetamine (ADDERALL) 10 MG tablet Take 1 tablet (10 mg total) by mouth 2 (two) times daily with a meal. Ok to fill 30 days after rx 60 tablet 0  . buPROPion (WELLBUTRIN SR) 150 MG 12 hr tablet Take 1 tablet (150 mg total) by mouth 2 (two) times daily. 60 tablet 5  . clonazePAM (KLONOPIN) 1 MG tablet TAKE 1 TABLET BY MOUTH 2 TIMES DAILY AS NEEDED FOR ANXIETY. 60 tablet 1  . cyclobenzaprine (FLEXERIL) 10 MG tablet Take 1 tablet (10 mg total) by mouth 3 (three) times daily as needed for muscle spasms. 30 tablet 0  . levonorgestrel-ethinyl estradiol (AVIANE,ALESSE,LESSINA) 0.1-20 MG-MCG tablet Take 1 tablet by mouth daily. Pt gets in [place] OTC 1 Package 11  . lovastatin (MEVACOR) 20 MG tablet Take 1 tablet (20 mg total) by mouth at bedtime. 90 tablet 3  . metFORMIN (GLUCOPHAGE) 500 MG tablet TAKE 1 TABLET (500 MG TOTAL) BY MOUTH 2 (TWO) TIMES DAILY WITH A MEAL. 60 tablet 5  . thyroid (ARMOUR) 90 MG tablet Take 1 tablet (90 mg total) by mouth daily before breakfast. 90 tablet 3   No current facility-administered medications on file prior to visit.     Review of  Systems:  As per HPI- otherwise negative.  No fever or chills, no CP or SOB No unexpected significant weight loss    Physical Examination: Vitals:   10/19/16 0901  BP: 124/82  Pulse: 94  Temp: 99.3 F (37.4 C)   Vitals:   10/19/16 0901  Weight: 183 lb 6.4 oz (83.2 kg)  Height: 5' 1.5" (1.562 m)   Body mass index is 34.09 kg/m. Ideal Body Weight: Weight in (lb) to  have BMI = 25: 134.2  GEN: WDWN, NAD, Non-toxic, A & O x 3, obese, looks well HEENT: Atraumatic, Normocephalic. Neck supple. No masses, No LAD.  Bilateral TM wnl, oropharynx normal.  PEERL,EOMI.   Ears and Nose: No external deformity. CV: RRR, No M/G/R. No JVD. No thrill. No extra heart sounds. PULM: CTA B, no wheezes, crackles, rhonchi. No retractions. No resp. distress. No accessory muscle use. ABD: S, NT, ND, +BS. No rebound. No HSM. EXTR: No c/c/e NEURO Normal gait.  PSYCH: Normally interactive. Conversant. Not depressed or anxious appearing.  Calm demeanor.    Assessment and Plan: Controlled type 2 diabetes mellitus without complication, without long-term current use of insulin (HCC) - Plan: Hemoglobin A1c, Comprehensive metabolic panel  Hypothyroidism due to acquired atrophy of thyroid  Dysthymia  Dyslipidemia  Leg cramps - Plan: cyclobenzaprine (FLEXERIL) 10 MG tablet  Here today for a follow-up visit  Will obtain an A1c, CMP TSH is UTD- checked in February of this year Refilled her flexeril  Will plan further follow- up pending labs.  Signed Lamar Blinks, MD  Received her labs- mychart message to pt  Results for orders placed or performed in visit on 10/19/16  Hemoglobin A1c  Result Value Ref Range   Hgb A1c MFr Bld 7.6 (H) 4.6 - 6.5 %  Comprehensive metabolic panel  Result Value Ref Range   Sodium 135 135 - 145 mEq/L   Potassium 4.7 3.5 - 5.1 mEq/L   Chloride 102 96 - 112 mEq/L   CO2 27 19 - 32 mEq/L   Glucose, Bld 133 (H) 70 - 99 mg/dL   BUN 7 6 - 23 mg/dL   Creatinine, Ser  0.76 0.40 - 1.20 mg/dL   Total Bilirubin 0.4 0.2 - 1.2 mg/dL   Alkaline Phosphatase 44 39 - 117 U/L   AST 15 0 - 37 U/L   ALT 19 0 - 35 U/L   Total Protein 7.4 6.0 - 8.3 g/dL   Albumin 3.9 3.5 - 5.2 g/dL   Calcium 9.3 8.4 - 10.5 mg/dL   GFR 87.28 >60.00 mL/min

## 2016-10-19 NOTE — Patient Instructions (Signed)
It was nice to see you today!  Take care and I will be in touch with your labs asap  I refilled your flexeril today We will check on how your diabetes is doing If your intestinal symptoms do not clear up soon let me know and I will refer you to a GI doctor

## 2016-10-20 MED ORDER — SITAGLIPTIN PHOSPHATE 100 MG PO TABS: 100.0000 mg | ORAL_TABLET | Freq: Every day | ORAL | 6 refills | Status: DC

## 2016-10-25 ENCOUNTER — Ambulatory Visit (INDEPENDENT_AMBULATORY_CARE_PROVIDER_SITE_OTHER): Payer: 59 | Admitting: Neurology

## 2016-10-25 DIAGNOSIS — F419 Anxiety disorder, unspecified: Secondary | ICD-10-CM

## 2016-10-25 DIAGNOSIS — I495 Sick sinus syndrome: Secondary | ICD-10-CM

## 2016-10-25 DIAGNOSIS — M797 Fibromyalgia: Secondary | ICD-10-CM

## 2016-10-25 DIAGNOSIS — G9332 Myalgic encephalomyelitis/chronic fatigue syndrome: Secondary | ICD-10-CM

## 2016-10-25 DIAGNOSIS — G4710 Hypersomnia, unspecified: Secondary | ICD-10-CM

## 2016-10-25 DIAGNOSIS — F458 Other somatoform disorders: Secondary | ICD-10-CM

## 2016-10-25 DIAGNOSIS — F5104 Psychophysiologic insomnia: Secondary | ICD-10-CM

## 2016-10-25 DIAGNOSIS — R5382 Chronic fatigue, unspecified: Secondary | ICD-10-CM

## 2016-11-05 DIAGNOSIS — F458 Other somatoform disorders: Secondary | ICD-10-CM

## 2016-11-05 DIAGNOSIS — F419 Anxiety disorder, unspecified: Secondary | ICD-10-CM | POA: Insufficient documentation

## 2016-11-05 DIAGNOSIS — F5104 Psychophysiologic insomnia: Secondary | ICD-10-CM | POA: Insufficient documentation

## 2016-11-05 NOTE — Procedures (Signed)
NAME: Sherri Cross                DOB: 06/02/1971 MEDICAL RECORD [id number]    DOS: 10/25/16 REFERRING PHYSICIAN: NP Mozingo      Study performed: HST Chief complaint according to patient : " I cannot sleep, cannot stay asleep '   HPI:  [name] is a 46 year old female patient. 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 She had tried multiple sleep aids with her previous primary care physician. She recently had changed providers and brought a bag of those medications to demonstrate her problem to new provider NP [facility], who recommended a sleep evaluation, concerned that the patient had in over 15 years not found lasting relief from insomnia with any of the medications. She did not recall all the sleep aids that have failed. She has tried Ambien, Physiological scientist, Sonata, Xanax, and Klonopin. She states that she does not snore, her husband has never reported any apneas, and she's not restlessly moving, kicking, nor yelling or sleep- walking.     Epworth Sleepiness score endorsed at 1 point, BMI 34  STUDY RESULTS: Total Recording Time: 6h 11m, Total Apnea/Hypopnea Index (AHI): 2.1/hr. Average Oxygen Saturation: SpO2 at 97%, Nadir (Lowest Oxygen) Saturation: SpO2 80% Total desaturation time was 2 minutes. Average Heart Rate: 82 bpm (57 to 155 bpm)  IMPRESSION: HST does not reveal evidence of central or obstructive sleep apnea. The patient has regular rhythm but fast and slow heart beat periods. In a HST these can be related to movement.  RECOMMENDATION: I have not found an organic reason for insomnia. Since HST do not provide EEG recording for sleep architecture, nor EMG and video, and there is a possibility of aberrant sleep perception.  I recommend to perform an attended PSG to evaluate if the patient truly experiences insomnia. Non organic insomnia would  not be followed by the sleep clinic.  I certify that I have reviewed the raw data recording prior to the issuance of this report in accordance with the standards of Accreditation of the American Academy of Sleep medicine (AASM) Larey Seat, MD    11-02-2016    Diplomat, American Board of Psychiatry and Neurology and the [employer] of Sleep Medicine, Medical Director of [facility] at [id number]

## 2016-11-05 NOTE — Addendum Note (Signed)
Addended by: Larey Seat on: 11/05/2016 01:20 PM   Modules accepted: Orders

## 2016-11-06 ENCOUNTER — Telehealth: Payer: Self-pay | Admitting: *Deleted

## 2016-11-06 NOTE — Telephone Encounter (Signed)
-----   Message from Larey Seat, MD sent at 11/05/2016  1:20 PM EDT ----- The HST provides no way to know if a patient is asleep or not. The variable heart rate can relate to anxiety or panic attacks. I will order a PSG ( attended study) to be performed to evaluate further./

## 2016-11-06 NOTE — Telephone Encounter (Signed)
LMTC./fim 

## 2016-11-14 NOTE — Telephone Encounter (Signed)
I called pt again to discuss sleep study results. No answer, left a message asking her to call me back. 

## 2016-11-21 NOTE — Telephone Encounter (Signed)
I called pt again to discuss her sleep study results. Pt has not answered or returned 3 of our phone calls. I will send her a letter via mychart, asking her to call me back.

## 2016-12-24 ENCOUNTER — Other Ambulatory Visit: Payer: Self-pay | Admitting: Family Medicine

## 2016-12-24 DIAGNOSIS — R252 Cramp and spasm: Secondary | ICD-10-CM

## 2017-01-04 ENCOUNTER — Telehealth: Payer: Self-pay | Admitting: Neurology

## 2017-01-04 NOTE — Telephone Encounter (Signed)
We have attempted to call the patient 3 times to schedule sleep study. Patient has been unavailable at the phone numbers we have on file and has not returned our calls. At this point we will send a letter asking pt to please contact the sleep lab to schedule their sleep study. If patient calls back we will schedule them for their sleep study. ° °

## 2017-02-19 ENCOUNTER — Other Ambulatory Visit: Payer: Self-pay | Admitting: Family Medicine

## 2017-02-19 DIAGNOSIS — IMO0001 Reserved for inherently not codable concepts without codable children: Secondary | ICD-10-CM

## 2017-02-19 DIAGNOSIS — E1165 Type 2 diabetes mellitus with hyperglycemia: Principal | ICD-10-CM

## 2017-03-26 ENCOUNTER — Other Ambulatory Visit: Payer: Self-pay | Admitting: Family Medicine

## 2017-03-26 DIAGNOSIS — R252 Cramp and spasm: Secondary | ICD-10-CM

## 2017-03-27 ENCOUNTER — Other Ambulatory Visit: Payer: Self-pay | Admitting: Emergency Medicine

## 2017-03-27 ENCOUNTER — Other Ambulatory Visit: Payer: Self-pay | Admitting: Family Medicine

## 2017-03-27 DIAGNOSIS — R252 Cramp and spasm: Secondary | ICD-10-CM

## 2017-03-27 NOTE — Telephone Encounter (Signed)
Requesting: cyclobenzaprine (FLEXERIL) 10 MG tablet Contract UDS Last OV: 10/19/16 Last Refill: 12/28/16  Please Advise

## 2017-04-22 NOTE — Progress Notes (Addendum)
[place] at [facility] [address], [place], [place] [address] [phone]  Date:  04/23/2017   Name:  Sherri Cross   DOB:  1970/06/19   MRN:  [id number]  PCP:  Darreld Mclean, MD    Chief Complaint: No chief complaint on file.   History of Present Illness:  Sherri Cross is a 46 y.o. very pleasant female patient who presents with the following:  History of DM, depression/ anxiety, hypothyroidism Here today for a recheck visit  Last seen by myself in May, at which time she had several complaints.  She was referred to neurology but did not get a sleep study done  Eye exam: she plans to do this by the end of the year  Flu shot: will do for her today Pneumonia shot; we did give her a prevnar 13 last year (in error, she was supposed to get a prevnar)so she is ok for now Urine micro: due  She is on lovastatin, metformin BID and januvia daily She is having fewer SE from her metformin, her GI system is doing much better right now  Lab Results  Component Value Date   HGBA1C 7.7 (H) 04/23/2017   Lab Results  Component Value Date   TSH 0.87 04/23/2017   She notes that she does not feel as hot as she has in the past- would like to check her TSH today She did not eat much so far today- coffee with cream, cereal bar Will check her cholesterol for her today as well- she admits that she has not been watching her diet all that well  She has some chronic shoulder pain and has used flexeril- she would be happy with 2 a day  She caught herself from falling down stairs with her her left arm 2-3 months ago,  The left shoulder has not seemed quite the same since. She would like to see ortho about her shoulder  She notes that her menses are heavy and painful, even with OCP She is known to have endometriosis- she is a pt of Dr. Charlesetta Garibaldi, but has not seen her in 7-8 hours  She did have an ablation 7-8 years ago Would like to  go back to Dr. Charlesetta Garibaldi for follow-up and possible repeat ablation  Patient Active Problem List   Diagnosis Date Noted  . Anxiety hyperventilation 11/05/2016  . Psychophysiological insomnia 11/05/2016  . Chronic fatigue syndrome with fibromyalgia 08/10/2016  . Diabetes mellitus type 2, uncontrolled, without complications ([place]) 40/97/3532  . Insomnia 10/21/2015  . Anxiety disorder, unspecified 10/21/2015  . Hypothyroidism due to acquired atrophy of thyroid 10/21/2015  . Panic attacks 10/21/2015    Past Medical History:  Diagnosis Date  . Anemia   . Chronic fatigue syndrome with fibromyalgia 08/10/2016  . Panic attacks 10/21/2015  . Thyroid disease     Past Surgical History:  Procedure Laterality Date  . Enometrial    . POLYPECTOMY     Uterus    Social History   Tobacco Use  . Smoking status: Never Smoker  . Smokeless tobacco: Current User  Substance Use Topics  . Alcohol use: No  . Drug use: No    History reviewed. No pertinent family history.  No Known Allergies  Medication list has been reviewed and updated.  Current Outpatient Medications on File Prior to Visit  Medication Sig Dispense Refill  . ALPRAZolam (XANAX) 1 MG tablet Take 1/2 or 1 tablet daily  if needed for panic attack.  Do not combine with klonpin 20 tablet 0  . amphetamine-dextroamphetamine (ADDERALL) 10 MG tablet Take 1 tablet (10 mg total) by mouth 2 (two) times daily with a meal. Ok to fill 30 days after rx 60 tablet 0  . buPROPion (WELLBUTRIN SR) 150 MG 12 hr tablet Take 1 tablet (150 mg total) by mouth 2 (two) times daily. 60 tablet 5  . clonazePAM (KLONOPIN) 1 MG tablet TAKE 1 TABLET BY MOUTH 2 TIMES DAILY AS NEEDED FOR ANXIETY. 60 tablet 1  . levonorgestrel-ethinyl estradiol (AVIANE,ALESSE,LESSINA) 0.1-20 MG-MCG tablet Take 1 tablet by mouth daily. Pt gets in [place] OTC 1 Package 11  . lovastatin (MEVACOR) 20 MG tablet Take 1 tablet (20 mg total) by mouth at bedtime. 90 tablet 3  . metFORMIN  (GLUCOPHAGE) 500 MG tablet TAKE 1 TABLET (500 MG TOTAL) BY MOUTH 2 (TWO) TIMES DAILY WITH A MEAL. 60 tablet 5  . sitaGLIPtin (JANUVIA) 100 MG tablet Take 1 tablet (100 mg total) by mouth daily. 30 tablet 6  . thyroid (ARMOUR) 90 MG tablet Take 1 tablet (90 mg total) by mouth daily before breakfast. 90 tablet 3   No current facility-administered medications on file prior to visit.     Review of Systems:  As per HPI- otherwise negative. No fever or chills In better spirits today than she is generally    Physical Examination: Vitals:   04/23/17 1321  BP: 118/86  Pulse: 93  Temp: 98.5 F (36.9 C)   Vitals:   04/23/17 1321  Weight: 184 lb 12.8 oz (83.8 kg)   Body mass index is 34.35 kg/m. Ideal Body Weight:    GEN: WDWN, NAD, Non-toxic, A & O x 3, obese, looks well HEENT: Atraumatic, Normocephalic. Neck supple. No masses, No LAD.   Bilateral TM wnl, oropharynx normal.  PEERL,EOMI.   Ears and Nose: No external deformity. CV: RRR, No M/G/R. No JVD. No thrill. No extra heart sounds. PULM: CTA B, no wheezes, crackles, rhonchi. No retractions. No resp. distress. No accessory muscle use. ABD: S, NT, ND, +BS. No rebound. No HSM. EXTR: No c/c/e NEURO Normal gait.  PSYCH: Normally interactive. Conversant. Not depressed or anxious appearing.  Calm demeanor.  Left shoulder: she cannot abduct her shoulder past 110 degrees.  She has pain with internal and external rotation.  May be developing adhesive capsulitis   Assessment and Plan: Controlled type 2 diabetes mellitus without complication, without long-term current use of insulin ([place]) - Plan: Microalbumin / creatinine urine ratio, Hemoglobin A1c, CBC, Comprehensive metabolic panel  Hypothyroidism due to acquired atrophy of thyroid - Plan: TSH  Dysthymia  Dyslipidemia - Plan: Lipid panel  Dysmenorrhea - Plan: Ambulatory referral to Obstetrics / Gynecology  Sprain of left shoulder, unspecified shoulder sprain type, initial  encounter - Plan: Ambulatory referral to Orthopedic Surgery, cyclobenzaprine (FLEXERIL) 10 MG tablet  Leg cramps - Plan: cyclobenzaprine (FLEXERIL) 10 MG tablet  Here today to follow-up on a few concerns today Referral to GYN for heavy and painful menses Referral to ortho for her left shoulder Labs as above to monitor her DM, hypothyroidism, lipids  Signed Lamar Blinks, MD 11/20 Saw critical potassium at 8:25pm.  A follow-up K was ordered in chart but otherwise I am not sure what happened with this today.  Called pt- she did not answer.  Left detailed message on machine- I would advise her to go to ER as her K was quite high- this is most conservative option.  However if she is not able to do this can recheck tomorrow.   Will also sent her a mychart message   Results for orders placed or performed in visit on 04/23/17  Microalbumin / creatinine urine ratio  Result Value Ref Range   Microalb, Ur 53.8 (H) 0.0 - 1.9 mg/dL   Creatinine,U 189.8 mg/dL   Microalb Creat Ratio 28.3 0.0 - 30.0 mg/g  Hemoglobin A1c  Result Value Ref Range   Hgb A1c MFr Bld 7.7 (H) 4.6 - 6.5 %  CBC  Result Value Ref Range   WBC 9.2 4.0 - 10.5 K/uL   RBC 4.91 3.87 - 5.11 Mil/uL   Platelets 352.0 150.0 - 400.0 K/uL   Hemoglobin 14.4 12.0 - 15.0 g/dL   HCT 44.4 36.0 - 46.0 %   MCV 90.4 78.0 - 100.0 fl   MCHC 32.5 30.0 - 36.0 g/dL   RDW 13.5 11.5 - 15.5 %  Comprehensive metabolic panel  Result Value Ref Range   Sodium 135 135 - 145 mEq/L   Potassium 6.7 (HH) 3.5 - 5.1 mEq/L   Chloride 101 96 - 112 mEq/L   CO2 26 19 - 32 mEq/L   Glucose, Bld 94 70 - 99 mg/dL   BUN 6 6 - 23 mg/dL   Creatinine, Ser 0.68 0.40 - 1.20 mg/dL   Total Bilirubin 0.3 0.2 - 1.2 mg/dL   Alkaline Phosphatase 51 39 - 117 U/L   AST 17 0 - 37 U/L   ALT 13 0 - 35 U/L   Total Protein 7.8 6.0 - 8.3 g/dL   Albumin 4.0 3.5 - 5.2 g/dL   Calcium 9.9 8.4 - 10.5 mg/dL   GFR 99.01 >60.00 mL/min  TSH  Result Value Ref Range   TSH 0.87  0.35 - 4.50 uIU/mL  Lipid panel  Result Value Ref Range   Cholesterol 223 (H) 0 - 200 mg/dL   Triglycerides 140.0 0.0 - 149.0 mg/dL   HDL 38.70 (L) >39.00 mg/dL   VLDL 28.0 0.0 - 40.0 mg/dL   LDL Cholesterol 157 (H) 0 - 99 mg/dL   Total CHOL/HDL Ratio 6    NonHDL 184.77

## 2017-04-23 ENCOUNTER — Ambulatory Visit (INDEPENDENT_AMBULATORY_CARE_PROVIDER_SITE_OTHER): Payer: Self-pay | Admitting: Family Medicine

## 2017-04-23 ENCOUNTER — Encounter: Payer: Self-pay | Admitting: Family Medicine

## 2017-04-23 VITALS — BP 118/86 | HR 93 | Temp 98.5°F | Wt 184.8 lb

## 2017-04-23 DIAGNOSIS — E034 Atrophy of thyroid (acquired): Secondary | ICD-10-CM

## 2017-04-23 DIAGNOSIS — N946 Dysmenorrhea, unspecified: Secondary | ICD-10-CM

## 2017-04-23 DIAGNOSIS — R252 Cramp and spasm: Secondary | ICD-10-CM

## 2017-04-23 DIAGNOSIS — E785 Hyperlipidemia, unspecified: Secondary | ICD-10-CM

## 2017-04-23 DIAGNOSIS — S43402A Unspecified sprain of left shoulder joint, initial encounter: Secondary | ICD-10-CM

## 2017-04-23 DIAGNOSIS — F341 Dysthymic disorder: Secondary | ICD-10-CM

## 2017-04-23 DIAGNOSIS — E119 Type 2 diabetes mellitus without complications: Secondary | ICD-10-CM

## 2017-04-23 MED ORDER — CYCLOBENZAPRINE HCL 10 MG PO TABS
5.0000 mg | ORAL_TABLET | Freq: Two times a day (BID) | ORAL | 3 refills | Status: DC | PRN
Start: 2017-04-23 — End: 2017-09-18

## 2017-04-23 NOTE — Patient Instructions (Signed)
It was nice to see you again today- I will be in touch with your labs asap  I have put in referrals to orthopedics for your shoulder, and to Dr. Charlesetta Garibaldi for your heavy, painful periods I did refill your cyclobenzaprine- remember this medication can make you feel drowsy, more so with the klonpin. Please be cautious and aware

## 2017-04-24 ENCOUNTER — Encounter: Payer: Self-pay | Admitting: Family Medicine

## 2017-04-24 ENCOUNTER — Other Ambulatory Visit: Payer: Self-pay | Admitting: Family Medicine

## 2017-04-24 DIAGNOSIS — E875 Hyperkalemia: Secondary | ICD-10-CM

## 2017-04-24 LAB — LIPID PANEL
CHOLESTEROL: 223 mg/dL — AB (ref 0–200)
HDL: 38.7 mg/dL — ABNORMAL LOW (ref >39.00)
LDL CALC: 157 mg/dL — AB (ref 0–99)
NonHDL: 184.77
Total CHOL/HDL Ratio: 6
Triglycerides: 140 mg/dL (ref 0.0–149.0)
VLDL: 28 mg/dL (ref 0.0–40.0)

## 2017-04-24 LAB — COMPREHENSIVE METABOLIC PANEL
ALBUMIN: 4 g/dL (ref 3.5–5.2)
ALK PHOS: 51 U/L (ref 39–117)
ALT: 13 U/L (ref 0–35)
AST: 17 U/L (ref 0–37)
BILIRUBIN TOTAL: 0.3 mg/dL (ref 0.2–1.2)
BUN: 6 mg/dL (ref 6–23)
CALCIUM: 9.9 mg/dL (ref 8.4–10.5)
CO2: 26 mEq/L (ref 19–32)
CREATININE: 0.68 mg/dL (ref 0.40–1.20)
Chloride: 101 mEq/L (ref 96–112)
GFR: 99.01 mL/min (ref >60.00)
Glucose, Bld: 94 mg/dL (ref 70–99)
Potassium: 6.7 mEq/L (ref 3.5–5.1)
Sodium: 135 mEq/L (ref 135–145)
Total Protein: 7.8 g/dL (ref 6.0–8.3)

## 2017-04-24 LAB — MICROALBUMIN / CREATININE URINE RATIO
Creatinine,U: 189.8 mg/dL
MICROALB/CREAT RATIO: 28.3 mg/g (ref 0.0–30.0)
Microalb, Ur: 53.8 mg/dL — ABNORMAL HIGH (ref 0.0–1.9)

## 2017-04-24 LAB — CBC
HCT: 44.4 % (ref 36.0–46.0)
Hemoglobin: 14.4 g/dL (ref 12.0–15.0)
MCHC: 32.5 g/dL (ref 30.0–36.0)
MCV: 90.4 fl (ref 78.0–100.0)
PLATELETS: 352 10*3/uL (ref 150.0–400.0)
RBC: 4.91 Mil/uL (ref 3.87–5.11)
RDW: 13.5 % (ref 11.5–15.5)
WBC: 9.2 10*3/uL (ref 4.0–10.5)

## 2017-04-24 LAB — HEMOGLOBIN A1C: HEMOGLOBIN A1C: 7.7 % — AB (ref 4.6–6.5)

## 2017-04-24 LAB — TSH: TSH: 0.87 u[IU]/mL (ref 0.35–4.50)

## 2017-04-29 ENCOUNTER — Encounter: Payer: Self-pay | Admitting: Family Medicine

## 2017-05-10 DIAGNOSIS — M25512 Pain in left shoulder: Secondary | ICD-10-CM | POA: Diagnosis not present

## 2017-05-10 DIAGNOSIS — M25511 Pain in right shoulder: Secondary | ICD-10-CM | POA: Diagnosis not present

## 2017-05-10 DIAGNOSIS — M7540 Impingement syndrome of unspecified shoulder: Secondary | ICD-10-CM | POA: Diagnosis not present

## 2017-05-16 ENCOUNTER — Other Ambulatory Visit: Payer: Self-pay | Admitting: Family Medicine

## 2017-05-21 ENCOUNTER — Other Ambulatory Visit: Payer: Self-pay | Admitting: Emergency Medicine

## 2017-05-21 DIAGNOSIS — IMO0001 Reserved for inherently not codable concepts without codable children: Secondary | ICD-10-CM

## 2017-05-21 DIAGNOSIS — E1165 Type 2 diabetes mellitus with hyperglycemia: Principal | ICD-10-CM

## 2017-05-21 MED ORDER — METFORMIN HCL 500 MG PO TABS: 500.0000 mg | ORAL_TABLET | Freq: Two times a day (BID) | ORAL | 5 refills | Status: DC

## 2017-06-07 ENCOUNTER — Telehealth: Payer: Self-pay

## 2017-06-07 NOTE — Telephone Encounter (Signed)
Patient had home sleep study on 10-25-16. Negative study but tried to get authorization from [facility] and it was denied. Found paperwork in folder that [place] left. I attempted again with documentation of home study and it was denied again. Do you want me to repeat HST with a watchpat?

## 2017-06-08 NOTE — Telephone Encounter (Signed)
Sherri Cross, please try again. If her HST was not consistent with her clinical history, lets do Watch pat. [place]!!!

## 2017-06-11 ENCOUNTER — Other Ambulatory Visit: Payer: Self-pay | Admitting: Family Medicine

## 2017-06-11 ENCOUNTER — Telehealth: Payer: Self-pay

## 2017-06-11 NOTE — Telephone Encounter (Signed)
Left message to reschedule home study with watchpat

## 2017-07-19 ENCOUNTER — Other Ambulatory Visit: Payer: Self-pay | Admitting: Family Medicine

## 2017-08-09 ENCOUNTER — Other Ambulatory Visit: Payer: Self-pay | Admitting: Family Medicine

## 2017-08-09 DIAGNOSIS — E034 Atrophy of thyroid (acquired): Secondary | ICD-10-CM

## 2017-08-10 ENCOUNTER — Other Ambulatory Visit: Payer: Self-pay | Admitting: Emergency Medicine

## 2017-08-10 DIAGNOSIS — E034 Atrophy of thyroid (acquired): Secondary | ICD-10-CM

## 2017-08-10 MED ORDER — ARMOUR THYROID 90 MG PO TABS: ORAL_TABLET | ORAL | 1 refills | Status: DC

## 2017-09-18 ENCOUNTER — Other Ambulatory Visit: Payer: Self-pay | Admitting: Family Medicine

## 2017-09-18 DIAGNOSIS — S43402A Unspecified sprain of left shoulder joint, initial encounter: Secondary | ICD-10-CM

## 2017-09-18 DIAGNOSIS — R252 Cramp and spasm: Secondary | ICD-10-CM

## 2017-09-20 ENCOUNTER — Other Ambulatory Visit: Payer: Self-pay | Admitting: Family Medicine

## 2017-11-22 ENCOUNTER — Other Ambulatory Visit: Payer: Self-pay | Admitting: Family Medicine

## 2017-12-25 ENCOUNTER — Other Ambulatory Visit: Payer: Self-pay | Admitting: Family Medicine

## 2017-12-25 DIAGNOSIS — E1165 Type 2 diabetes mellitus with hyperglycemia: Principal | ICD-10-CM

## 2017-12-25 DIAGNOSIS — IMO0001 Reserved for inherently not codable concepts without codable children: Secondary | ICD-10-CM

## 2018-02-02 ENCOUNTER — Other Ambulatory Visit: Payer: Self-pay | Admitting: Family Medicine

## 2018-02-02 DIAGNOSIS — E1165 Type 2 diabetes mellitus with hyperglycemia: Principal | ICD-10-CM

## 2018-02-02 DIAGNOSIS — IMO0001 Reserved for inherently not codable concepts without codable children: Secondary | ICD-10-CM

## 2018-02-11 ENCOUNTER — Telehealth: Payer: Self-pay

## 2018-02-11 DIAGNOSIS — IMO0001 Reserved for inherently not codable concepts without codable children: Secondary | ICD-10-CM

## 2018-02-11 DIAGNOSIS — E1165 Type 2 diabetes mellitus with hyperglycemia: Principal | ICD-10-CM

## 2018-02-11 MED ORDER — METFORMIN HCL 500 MG PO TABS: 500.0000 mg | ORAL_TABLET | Freq: Two times a day (BID) | ORAL | 0 refills | Status: DC

## 2018-02-11 MED ORDER — SITAGLIPTIN PHOSPHATE 100 MG PO TABS: ORAL_TABLET | ORAL | 0 refills | Status: DC

## 2018-02-11 NOTE — Telephone Encounter (Signed)
Copied from [place] [phone]. Topic: General - Other >> Feb 08, 2018  6:08 PM Yvette Rack wrote: Reason for CRM: Pt states she will be running out of the JANUVIA 100 MG tablet and metFORMIN (GLUCOPHAGE) 500 MG tablet prior to the her appt scheduled on 02/27/18 at 12pm. Cb# [phone]

## 2018-02-24 NOTE — Progress Notes (Addendum)
[place] at [facility] [place], [place], [place] [address] [phone]  Date:  02/27/2018   Name:  Sherri Cross   DOB:  18-Jun-1970   MRN:  [id number]  PCP:  Darreld Mclean, MD    Chief Complaint: Annual Exam (L thigh mole concern)   History of Present Illness:  Sherri Cross is a 47 y.o. very pleasant female patient who presents with the following:  Here today for a CPE History of DM, anxiety/ insomnia/ chronic fatigue/ FBM, hypothyroid Using metformin, januvia for her DM  I last saw her in November of last year  She notes that her menses are heavy and painful, even with OCP She is known to have endometriosis- she is a pt of Dr. Charlesetta Garibaldi, but has not seen her in 7-8 years She did have an ablation 7-8 years ago Would like to go back to Dr. Charlesetta Garibaldi for follow-up and possible repeat ablation  Lab Results  Component Value Date   TSH 0.87 04/23/2017   Lab Results  Component Value Date   HGBA1C 7.7 (H) 04/23/2017   Eye exam:  She is due and is reminded to do this  Labs; she had a protein shake so far today. She does not check her glucose at home Do foot exam today  Flu: do today  Offer pneumovax- she was given a prevnar by mistake in 2017. She would like to have pneumovax today  Pap: 2017, normal Mammo: ?2014-she had an incomplete in 2014 but did not go back.  She is willing to have this repeated and I will schedule this for her  She is seeing psychiatry for her depression- she is doing something called [place] therapy in addition to her medication. She just started this therapy and is not sure how much it is helping so far   Xanax- she uses on rare occasion for a panic attack, she gets from her psychiatrist.  Deloria Lair adderall for her chronic fatigue- this does help her, 'its the only way I can function"  [place] thyroid wellbutrin Klonopin OCP- gets from [place] Lovastatin Metformin  januvia   Sertraline 150  NCCSR:  02/05/2018  2  11/28/2017  Dextroamp-Amphetamin 10 Mg Tab  60.00 30 Re Moz  [id number]  Wal ([id number])  0/0  Comm Ins  [place]  02/02/2018  2  11/28/2017  Clonazepam 1 Mg Tablet  75.00 25 Re Moz  [id number]  Wal ([id number])  1/2 6.00 LME Comm Ins  [place]  12/25/2017  2  11/28/2017  Clonazepam 1 Mg Tablet  75.00 25 Re Moz  [id number]  Wal ([id number])  0/2 6.00 LME Comm Ins  [place]  12/04/2017  2  11/28/2017  Dextroamp-Amphetamin 10 Mg Tab  60.00 30 Re Moz  [id number]  Wal ([id number])  0/0  Comm Ins  [place]  11/22/2017  2  09/20/2017  Clonazepam 1 Mg Tablet  75.00 25 Re Moz  [id number]  Wal ([id number])  2/2 6.00 LME Comm Ins  [place]  10/24/2017  2  09/20/2017  Clonazepam 1 Mg Tablet  75.00 25 Re Moz  [id number]  Wal ([id number])  1/2 6.00 LME Comm Ins  [place]  09/20/2017  2  09/20/2017  Clonazepam 1 Mg Tablet  75.00 25 Re Moz  [id number]  Wal ([id number])  0/2 6.00 LME Comm Ins    09/15/2017  2  04/10/2017  Dextroamp-Amphetamin 10 Mg Tab  60.00 30 Re Moz  [id number]  Wal ([id number])  0/0  Comm Ins  [place]  08/20/2017  2  08/20/2017  Clonazepam 1 Mg Tablet  75.00 30 [employer]  [id number]  Wal ([phone])  0/0 5.00 LME Comm Ins  [place]  07/19/2017  2  06/16/2017  Dextroamp-Amphetamin 10 Mg Tab  51.00 26 Re Moz  [id number]  Wal [phone])  0/0         Patient Active Problem List   Diagnosis Date Noted  . Anxiety hyperventilation 11/05/2016  . Psychophysiological insomnia 11/05/2016  . Chronic fatigue syndrome with fibromyalgia 08/10/2016  . Diabetes mellitus type 2, uncontrolled, without complications ([place]) 85/27/7824  . Insomnia 10/21/2015  . Anxiety disorder, unspecified 10/21/2015  . Hypothyroidism due to acquired atrophy of thyroid 10/21/2015  . Panic attacks 10/21/2015    Past Medical History:  Diagnosis Date  . Anemia   . Chronic fatigue syndrome with fibromyalgia 08/10/2016  . Diabetes mellitus type 2, uncontrolled, without complications ([place])   . Panic attacks 10/21/2015  . Thyroid disease     Past Surgical History:  Procedure Laterality Date  . Enometrial    .  POLYPECTOMY     Uterus    Social History   Tobacco Use  . Smoking status: Never Smoker  . Smokeless tobacco: Current User  Substance Use Topics  . Alcohol use: No  . Drug use: No    Family History  Problem Relation Age of Onset  . Hypertension Mother   . Diabetes Father     No Known Allergies  Medication list has been reviewed and updated.  Current Outpatient Medications on File Prior to Visit  Medication Sig Dispense Refill  . ALPRAZolam (XANAX) 1 MG tablet Take 1/2 or 1 tablet daily if needed for panic attack.  Do not combine with klonpin 20 tablet 0  . amphetamine-dextroamphetamine (ADDERALL) 10 MG tablet Take 1 tablet (10 mg total) by mouth 2 (two) times daily with a meal. Ok to fill 30 days after rx 60 tablet 0  . ARMOUR THYROID 90 MG tablet TAKE 1 TABLET BY MOUTH ONCE DAILY BEFORE BREAKFAST 90 tablet 1  . buPROPion (WELLBUTRIN SR) 150 MG 12 hr tablet Take 1 tablet (150 mg total) by mouth 2 (two) times daily. (Patient taking differently: Take 150 mg by mouth daily. ) 60 tablet 5  . clonazePAM (KLONOPIN) 1 MG tablet TAKE 1 TABLET BY MOUTH 2 TIMES DAILY AS NEEDED FOR ANXIETY. 60 tablet 1  . cyclobenzaprine (FLEXERIL) 10 MG tablet TAKE 1/2 TO 1 (ONE-HALF TO ONE) TABLET BY MOUTH TWICE DAILY AS NEEDED FOR MUSCLE SPASM 60 tablet 3  . levonorgestrel-ethinyl estradiol (AVIANE,ALESSE,LESSINA) 0.1-20 MG-MCG tablet Take 1 tablet by mouth daily. Pt gets in [place] OTC 1 Package 11   No current facility-administered medications on file prior to visit.     Review of Systems:  As per HPI- otherwise negative. No fever or chills No CP or SOB She is not really exercising- just does not like to do so She has noted a skin nodule on her left thigh over the last few months- wanted to point it out    Physical Examination: Vitals:   02/27/18 1220  BP: 132/76  Pulse: 86  Resp: 16  Temp: 98.1 F (36.7 C)  SpO2: 98%   Vitals:   02/27/18 1220  Weight: 182 lb (82.6 kg)  Height: 5'  1.5" (1.562 m)   Body mass index is 33.83 kg/m. Ideal Body Weight: Weight in (lb) to have BMI = 25: 134.2  GEN: WDWN, NAD, Non-toxic,  A & O x 3, obese, otherwise looks well  HEENT: Atraumatic, Normocephalic. Neck supple. No masses, No LAD.  Bilateral TM wnl, oropharynx normal.  PEERL,EOMI.   Ears and Nose: No external deformity. CV: RRR, No M/G/R. No JVD. No thrill. No extra heart sounds. PULM: CTA B, no wheezes, crackles, rhonchi. No retractions. No resp. distress. No accessory muscle use. ABD: S, NT, ND, +BS. No rebound. No HSM. EXTR: No c/c/e NEURO Normal gait.  PSYCH: Normally interactive. Conversant. Not depressed or anxious appearing.  Calm demeanor.  Foot exam normal today  Left thigh- slightly pedunculated flesh toned nodule is present   Assessment and Plan: Physical exam  Dyslipidemia - Plan: Lipid panel, lovastatin (MEVACOR) 20 MG tablet  Controlled type 2 diabetes mellitus without complication, without long-term current use of insulin ([place]) - Plan: Comprehensive metabolic panel, Hemoglobin A1c, sitaGLIPtin (JANUVIA) 100 MG tablet, Microalbumin / creatinine urine ratio  Hypothyroidism due to acquired atrophy of thyroid - Plan: TSH  Dysthymia - Plan: CBC  Immunization due - Plan: Flu Vaccine QUAD 36+ mos IM, Pneumococcal polysaccharide vaccine 23-valent greater than or equal to 2yo subcutaneous/IM  Screening for breast cancer - Plan: MM 3D SCREEN BREAST BILATERAL  Uncontrolled type 2 diabetes mellitus without complication, without long-term current use of insulin ([facility]) - Plan: metFORMIN (GLUCOPHAGE) 500 MG tablet  Menorrhagia with regular cycle - Plan: Ambulatory referral to Obstetrics / Gynecology  CPE today  Refilled meds except thyroid- wait for TSH Referral back to OBG for her menorrhagia- she is interested in a hysterctomy Await her labs, A1c She is seeing psychiatry. Not suicidal but still depressed Pneumovax and flu shots today She will see me to remove skin  lesion on her leg at her convenience   Signed Lamar Blinks, MD  Received her labs 9/27- message to pt  Results for orders placed or performed in visit on 02/27/18  CBC  Result Value Ref Range   WBC 10.7 (H) 4.0 - 10.5 K/uL   RBC 4.68 3.87 - 5.11 Mil/uL   Platelets 366.0 150.0 - 400.0 K/uL   Hemoglobin 13.4 12.0 - 15.0 g/dL   HCT 40.8 36.0 - 46.0 %   MCV 87.0 78.0 - 100.0 fl   MCHC 32.9 30.0 - 36.0 g/dL   RDW 13.9 11.5 - 15.5 %  Comprehensive metabolic panel  Result Value Ref Range   Sodium 136 135 - 145 mEq/L   Potassium 3.9 3.5 - 5.1 mEq/L   Chloride 101 96 - 112 mEq/L   CO2 26 19 - 32 mEq/L   Glucose, Bld 174 (H) 70 - 99 mg/dL   BUN 12 6 - 23 mg/dL   Creatinine, Ser 0.78 0.40 - 1.20 mg/dL   Total Bilirubin 0.3 0.2 - 1.2 mg/dL   Alkaline Phosphatase 55 39 - 117 U/L   AST 8 0 - 37 U/L   ALT 8 0 - 35 U/L   Total Protein 7.0 6.0 - 8.3 g/dL   Albumin 3.8 3.5 - 5.2 g/dL   Calcium 9.6 8.4 - 10.5 mg/dL   GFR 84.20 >60.00 mL/min  Hemoglobin A1c  Result Value Ref Range   Hgb A1c MFr Bld 7.8 (H) 4.6 - 6.5 %  Lipid panel  Result Value Ref Range   Cholesterol 214 (H) 0 - 200 mg/dL   Triglycerides 157.0 (H) 0.0 - 149.0 mg/dL   HDL 37.70 (L) >39.00 mg/dL   VLDL 31.4 0.0 - 40.0 mg/dL   LDL Cholesterol 145 (H) 0 - 99 mg/dL  Total CHOL/HDL Ratio 6    NonHDL 176.16   TSH  Result Value Ref Range   TSH 0.42 0.35 - 4.50 uIU/mL  Microalbumin / creatinine urine ratio  Result Value Ref Range   Microalb, Ur 0.9 0.0 - 1.9 mg/dL   Creatinine,U 81.0 mg/dL   Microalb Creat Ratio 1.1 0.0 - 30.0 mg/g    Blood counts are ok- minimally high white cell count is all right Metabolic profile looks ok except for blood sugar Your A1c is too high for your age and is continuing to creep up.  We might go up on your metformin to 1000 mg twice a day.  If this does not work we can add a 3rd medication.  Would you be ok with my increasing your metformin?  If you have a lot of the 500 mg on hand  you can just take 2 twice a day until you finish it up.    Also, your cholesterol is high.  I think we need to step up from lovastatin to something more potent.  Did you just fill the lovastatin rx? Please let me know- if not I can call your pharmacy and cancel it, change to something else  Thyroid is ok, I refilled your thyroid replacement NO abnormal protein in your urine this time which is great news  Please let me know about the metformin and your cholesterol med.  Otherwise let's plan to visit in about 4 months

## 2018-02-27 ENCOUNTER — Ambulatory Visit (INDEPENDENT_AMBULATORY_CARE_PROVIDER_SITE_OTHER): Payer: 59 | Admitting: Family Medicine

## 2018-02-27 ENCOUNTER — Other Ambulatory Visit: Payer: Self-pay

## 2018-02-27 ENCOUNTER — Encounter: Payer: Self-pay | Admitting: Family Medicine

## 2018-02-27 VITALS — BP 132/76 | HR 86 | Temp 98.1°F | Resp 16 | Ht 61.5 in | Wt 182.0 lb

## 2018-02-27 DIAGNOSIS — E785 Hyperlipidemia, unspecified: Secondary | ICD-10-CM

## 2018-02-27 DIAGNOSIS — E034 Atrophy of thyroid (acquired): Secondary | ICD-10-CM | POA: Diagnosis not present

## 2018-02-27 DIAGNOSIS — N920 Excessive and frequent menstruation with regular cycle: Secondary | ICD-10-CM

## 2018-02-27 DIAGNOSIS — E119 Type 2 diabetes mellitus without complications: Secondary | ICD-10-CM

## 2018-02-27 DIAGNOSIS — Z1231 Encounter for screening mammogram for malignant neoplasm of breast: Secondary | ICD-10-CM

## 2018-02-27 DIAGNOSIS — Z23 Encounter for immunization: Secondary | ICD-10-CM | POA: Diagnosis not present

## 2018-02-27 DIAGNOSIS — F341 Dysthymic disorder: Secondary | ICD-10-CM

## 2018-02-27 DIAGNOSIS — IMO0001 Reserved for inherently not codable concepts without codable children: Secondary | ICD-10-CM

## 2018-02-27 DIAGNOSIS — E1165 Type 2 diabetes mellitus with hyperglycemia: Secondary | ICD-10-CM

## 2018-02-27 DIAGNOSIS — Z0000 Encounter for general adult medical examination without abnormal findings: Secondary | ICD-10-CM | POA: Diagnosis not present

## 2018-02-27 DIAGNOSIS — Z1239 Encounter for other screening for malignant neoplasm of breast: Secondary | ICD-10-CM

## 2018-02-27 MED ORDER — METFORMIN HCL 500 MG PO TABS: 500.0000 mg | ORAL_TABLET | Freq: Two times a day (BID) | ORAL | 3 refills | Status: DC

## 2018-02-27 MED ORDER — LOVASTATIN 20 MG PO TABS: 20.0000 mg | ORAL_TABLET | Freq: Every day | ORAL | 3 refills | Status: DC

## 2018-02-27 MED ORDER — SITAGLIPTIN PHOSPHATE 100 MG PO TABS: ORAL_TABLET | ORAL | 3 refills | Status: DC

## 2018-02-27 NOTE — Progress Notes (Signed)
Pt. Receiving [place], unsure if it is helpful. "adderrall only thing that seems to help". PHQ-9 score of 21 today.

## 2018-02-27 NOTE — Patient Instructions (Addendum)
I will be in touch with your labs asap We will need to refill your thyroid med once we get your TSH back  For heavy menses, I would recommend that we have you see Dr. Charlesetta Garibaldi again to discuss options- I put in a referral for you  Work on exercise for your physical and mental health!    Come and see me at your convenience for a 30 minute appt to have your mole removed Eye exam and mammo also due   Health Maintenance, Female Adopting a healthy lifestyle and getting preventive care can go a long way to promote health and wellness. Talk with your health care provider about what schedule of regular examinations is right for you. This is a good chance for you to check in with your provider about disease prevention and staying healthy. In between checkups, there are plenty of things you can do on your own. Experts have done a lot of research about which lifestyle changes and preventive measures are most likely to keep you healthy. Ask your health care provider for more information. Weight and diet Eat a healthy diet  Be sure to include plenty of vegetables, fruits, low-fat dairy products, and lean protein.  Do not eat a lot of foods high in solid fats, added sugars, or salt.  Get regular exercise. This is one of the most important things you can do for your health. ? Most adults should exercise for at least 150 minutes each week. The exercise should increase your heart rate and make you sweat (moderate-intensity exercise). ? Most adults should also do strengthening exercises at least twice a week. This is in addition to the moderate-intensity exercise.  Maintain a healthy weight  Body mass index (BMI) is a measurement that can be used to identify possible weight problems. It estimates body fat based on height and weight. Your health care provider can help determine your BMI and help you achieve or maintain a healthy weight.  For females 68 years of age and older: ? A BMI below 18.5 is considered  underweight. ? A BMI of 18.5 to 24.9 is normal. ? A BMI of 25 to 29.9 is considered overweight. ? A BMI of 30 and above is considered obese.  Watch levels of cholesterol and blood lipids  You should start having your blood tested for lipids and cholesterol at 47 years of age, then have this test every 5 years.  You may need to have your cholesterol levels checked more often if: ? Your lipid or cholesterol levels are high. ? You are older than 47 years of age. ? You are at high risk for heart disease.  Cancer screening Lung Cancer  Lung cancer screening is recommended for adults 59-69 years old who are at high risk for lung cancer because of a history of smoking.  A yearly low-dose CT scan of the lungs is recommended for people who: ? Currently smoke. ? Have quit within the past 15 years. ? Have at least a 30-pack-year history of smoking. A pack year is smoking an average of one pack of cigarettes a day for 1 year.  Yearly screening should continue until it has been 15 years since you quit.  Yearly screening should stop if you develop a health problem that would prevent you from having lung cancer treatment.  Breast Cancer  Practice breast self-awareness. This means understanding how your breasts normally appear and feel.  It also means doing regular breast self-exams. Let your health care provider know  about any changes, no matter how small.  If you are in your 20s or 30s, you should have a clinical breast exam (CBE) by a health care provider every 1-3 years as part of a regular health exam.  If you are 27 or older, have a CBE every year. Also consider having a breast X-ray (mammogram) every year.  If you have a family history of breast cancer, talk to your health care provider about genetic screening.  If you are at high risk for breast cancer, talk to your health care provider about having an MRI and a mammogram every year.  Breast cancer gene (BRCA) assessment is  recommended for women who have family members with BRCA-related cancers. BRCA-related cancers include: ? Breast. ? Ovarian. ? Tubal. ? Peritoneal cancers.  Results of the assessment will determine the need for genetic counseling and BRCA1 and BRCA2 testing.  Cervical Cancer Your health care provider may recommend that you be screened regularly for cancer of the pelvic organs (ovaries, uterus, and vagina). This screening involves a pelvic examination, including checking for microscopic changes to the surface of your cervix (Pap test). You may be encouraged to have this screening done every 3 years, beginning at age 62.  For women ages 9-65, health care providers may recommend pelvic exams and Pap testing every 3 years, or they may recommend the Pap and pelvic exam, combined with testing for human papilloma virus (HPV), every 5 years. Some types of HPV increase your risk of cervical cancer. Testing for HPV may also be done on women of any age with unclear Pap test results.  Other health care providers may not recommend any screening for nonpregnant women who are considered low risk for pelvic cancer and who do not have symptoms. Ask your health care provider if a screening pelvic exam is right for you.  If you have had past treatment for cervical cancer or a condition that could lead to cancer, you need Pap tests and screening for cancer for at least 20 years after your treatment. If Pap tests have been discontinued, your risk factors (such as having a new sexual partner) need to be reassessed to determine if screening should resume. Some women have medical problems that increase the chance of getting cervical cancer. In these cases, your health care provider may recommend more frequent screening and Pap tests.  Colorectal Cancer  This type of cancer can be detected and often prevented.  Routine colorectal cancer screening usually begins at 47 years of age and continues through 47 years of  age.  Your health care provider may recommend screening at an earlier age if you have risk factors for colon cancer.  Your health care provider may also recommend using home test kits to check for hidden blood in the stool.  A small camera at the end of a tube can be used to examine your colon directly (sigmoidoscopy or colonoscopy). This is done to check for the earliest forms of colorectal cancer.  Routine screening usually begins at age 50.  Direct examination of the colon should be repeated every 5-10 years through 47 years of age. However, you may need to be screened more often if early forms of precancerous polyps or small growths are found.  Skin Cancer  Check your skin from head to toe regularly.  Tell your health care provider about any new moles or changes in moles, especially if there is a change in a mole's shape or color.  Also tell your health care  provider if you have a mole that is larger than the size of a pencil eraser.  Always use sunscreen. Apply sunscreen liberally and repeatedly throughout the day.  Protect yourself by wearing long sleeves, pants, a wide-brimmed hat, and sunglasses whenever you are outside.  Heart disease, diabetes, and high blood pressure  High blood pressure causes heart disease and increases the risk of stroke. High blood pressure is more likely to develop in: ? People who have blood pressure in the high end of the normal range (130-139/85-89 mm Hg). ? People who are overweight or obese. ? People who are African American.  If you are 58-81 years of age, have your blood pressure checked every 3-5 years. If you are 62 years of age or older, have your blood pressure checked every year. You should have your blood pressure measured twice-once when you are at a hospital or clinic, and once when you are not at a hospital or clinic. Record the average of the two measurements. To check your blood pressure when you are not at a hospital or clinic, you  can use: ? An automated blood pressure machine at a pharmacy. ? A home blood pressure monitor.  If you are between 61 years and 38 years old, ask your health care provider if you should take aspirin to prevent strokes.  Have regular diabetes screenings. This involves taking a blood sample to check your fasting blood sugar level. ? If you are at a normal weight and have a low risk for diabetes, have this test once every three years after 47 years of age. ? If you are overweight and have a high risk for diabetes, consider being tested at a younger age or more often. Preventing infection Hepatitis B  If you have a higher risk for hepatitis B, you should be screened for this virus. You are considered at high risk for hepatitis B if: ? You were born in a country where hepatitis B is common. Ask your health care provider which countries are considered high risk. ? Your parents were born in a high-risk country, and you have not been immunized against hepatitis B (hepatitis B vaccine). ? You have HIV or AIDS. ? You use needles to inject street drugs. ? You live with someone who has hepatitis B. ? You have had sex with someone who has hepatitis B. ? You get hemodialysis treatment. ? You take certain medicines for conditions, including cancer, organ transplantation, and autoimmune conditions.  Hepatitis C  Blood testing is recommended for: ? Everyone born from [birth year] through 1965. ? Anyone with known risk factors for hepatitis C.  Sexually transmitted infections (STIs)  You should be screened for sexually transmitted infections (STIs) including gonorrhea and chlamydia if: ? You are sexually active and are younger than 47 years of age. ? You are older than 47 years of age and your health care provider tells you that you are at risk for this type of infection. ? Your sexual activity has changed since you were last screened and you are at an increased risk for chlamydia or gonorrhea. Ask your  health care provider if you are at risk.  If you do not have HIV, but are at risk, it may be recommended that you take a prescription medicine daily to prevent HIV infection. This is called pre-exposure prophylaxis (PrEP). You are considered at risk if: ? You are sexually active and do not regularly use condoms or know the HIV status of your partner(s). ? You take  drugs by injection. ? You are sexually active with a partner who has HIV.  Talk with your health care provider about whether you are at high risk of being infected with HIV. If you choose to begin PrEP, you should first be tested for HIV. You should then be tested every 3 months for as long as you are taking PrEP. Pregnancy  If you are premenopausal and you may become pregnant, ask your health care provider about preconception counseling.  If you may become pregnant, take 400 to 800 micrograms (mcg) of folic acid every day.  If you want to prevent pregnancy, talk to your health care provider about birth control (contraception). Osteoporosis and menopause  Osteoporosis is a disease in which the bones lose minerals and strength with aging. This can result in serious bone fractures. Your risk for osteoporosis can be identified using a bone density scan.  If you are 63 years of age or older, or if you are at risk for osteoporosis and fractures, ask your health care provider if you should be screened.  Ask your health care provider whether you should take a calcium or vitamin D supplement to lower your risk for osteoporosis.  Menopause may have certain physical symptoms and risks.  Hormone replacement therapy may reduce some of these symptoms and risks. Talk to your health care provider about whether hormone replacement therapy is right for you. Follow these instructions at home:  Schedule regular health, dental, and eye exams.  Stay current with your immunizations.  Do not use any tobacco products including cigarettes, chewing  tobacco, or electronic cigarettes.  If you are pregnant, do not drink alcohol.  If you are breastfeeding, limit how much and how often you drink alcohol.  Limit alcohol intake to no more than 1 drink per day for nonpregnant women. One drink equals 12 ounces of beer, 5 ounces of wine, or 1 ounces of hard liquor.  Do not use street drugs.  Do not share needles.  Ask your health care provider for help if you need support or information about quitting drugs.  Tell your health care provider if you often feel depressed.  Tell your health care provider if you have ever been abused or do not feel safe at home. This information is not intended to replace advice given to you by your health care provider. Make sure you discuss any questions you have with your health care provider. Document Released: 12/05/2010 Document Revised: 10/28/2015 Document Reviewed: 02/23/2015 Elsevier Interactive Patient Education  [employer].

## 2018-02-28 LAB — COMPREHENSIVE METABOLIC PANEL
ALK PHOS: 55 U/L (ref 39–117)
ALT: 8 U/L (ref 0–35)
AST: 8 U/L (ref 0–37)
Albumin: 3.8 g/dL (ref 3.5–5.2)
BUN: 12 mg/dL (ref 6–23)
CHLORIDE: 101 meq/L (ref 96–112)
CO2: 26 mEq/L (ref 19–32)
Calcium: 9.6 mg/dL (ref 8.4–10.5)
Creatinine, Ser: 0.78 mg/dL (ref 0.40–1.20)
GFR: 84.2 mL/min (ref >60.00)
GLUCOSE: 174 mg/dL — AB (ref 70–99)
POTASSIUM: 3.9 meq/L (ref 3.5–5.1)
SODIUM: 136 meq/L (ref 135–145)
Total Bilirubin: 0.3 mg/dL (ref 0.2–1.2)
Total Protein: 7 g/dL (ref 6.0–8.3)

## 2018-02-28 LAB — MICROALBUMIN / CREATININE URINE RATIO
CREATININE, U: 81 mg/dL
MICROALB UR: 0.9 mg/dL (ref 0.0–1.9)
MICROALB/CREAT RATIO: 1.1 mg/g (ref 0.0–30.0)

## 2018-02-28 LAB — CBC
HCT: 40.8 % (ref 36.0–46.0)
Hemoglobin: 13.4 g/dL (ref 12.0–15.0)
MCHC: 32.9 g/dL (ref 30.0–36.0)
MCV: 87 fl (ref 78.0–100.0)
Platelets: 366 10*3/uL (ref 150.0–400.0)
RBC: 4.68 Mil/uL (ref 3.87–5.11)
RDW: 13.9 % (ref 11.5–15.5)
WBC: 10.7 10*3/uL — AB (ref 4.0–10.5)

## 2018-02-28 LAB — LIPID PANEL
CHOL/HDL RATIO: 6
Cholesterol: 214 mg/dL — ABNORMAL HIGH (ref 0–200)
HDL: 37.7 mg/dL — AB (ref >39.00)
LDL Cholesterol: 145 mg/dL — ABNORMAL HIGH (ref 0–99)
NONHDL: 176.16
TRIGLYCERIDES: 157 mg/dL — AB (ref 0.0–149.0)
VLDL: 31.4 mg/dL (ref 0.0–40.0)

## 2018-02-28 LAB — HEMOGLOBIN A1C: HEMOGLOBIN A1C: 7.8 % — AB (ref 4.6–6.5)

## 2018-02-28 LAB — TSH: TSH: 0.42 u[IU]/mL (ref 0.35–4.50)

## 2018-03-01 ENCOUNTER — Encounter: Payer: Self-pay | Admitting: Family Medicine

## 2018-03-01 DIAGNOSIS — S43402A Unspecified sprain of left shoulder joint, initial encounter: Secondary | ICD-10-CM

## 2018-03-01 DIAGNOSIS — E1165 Type 2 diabetes mellitus with hyperglycemia: Secondary | ICD-10-CM

## 2018-03-01 DIAGNOSIS — E785 Hyperlipidemia, unspecified: Secondary | ICD-10-CM

## 2018-03-01 DIAGNOSIS — R252 Cramp and spasm: Secondary | ICD-10-CM

## 2018-03-01 DIAGNOSIS — IMO0001 Reserved for inherently not codable concepts without codable children: Secondary | ICD-10-CM

## 2018-03-01 MED ORDER — ARMOUR THYROID 90 MG PO TABS: ORAL_TABLET | ORAL | 3 refills | Status: DC

## 2018-03-01 MED ORDER — METFORMIN HCL 1000 MG PO TABS: 1000.0000 mg | ORAL_TABLET | Freq: Two times a day (BID) | ORAL | 3 refills | Status: DC

## 2018-03-01 MED ORDER — ROSUVASTATIN CALCIUM 20 MG PO TABS: 20.0000 mg | ORAL_TABLET | Freq: Every day | ORAL | 3 refills | Status: DC

## 2018-03-01 NOTE — Addendum Note (Signed)
Addended by: Lamar Blinks C on: 03/01/2018 06:09 AM   Modules accepted: Orders

## 2018-03-04 ENCOUNTER — Ambulatory Visit: Payer: 59 | Admitting: Family Medicine

## 2018-03-04 MED ORDER — CYCLOBENZAPRINE HCL 10 MG PO TABS: ORAL_TABLET | ORAL | 1 refills | Status: DC

## 2018-03-04 NOTE — Addendum Note (Signed)
Addended by: Lamar Blinks C on: 03/04/2018 09:05 AM   Modules accepted: Orders

## 2018-03-09 NOTE — Progress Notes (Addendum)
[facility] [address], [place], [place] [address] [phone]  Date:  03/11/2018   Name:  Sherri Cross   DOB:  [birth year]   MRN:  [id number]  PCP:  Darreld Mclean, MD    Chief Complaint: Procedure (left leg nodule)   History of Present Illness:  Sherri Cross is a 47 y.o. very pleasant female patient who presents with the following:  Recent CPE Here today to have a skin nodule removed from her leg  It has been present for about 1 year Not painful, just wanted to have it removed  Also some eczema on her right dorsal hand- would like something to use for this  Will give her some cream to use as needed  She is otherwise feeling well today   Patient Active Problem List   Diagnosis Date Noted  . Anxiety hyperventilation 11/05/2016  . Psychophysiological insomnia 11/05/2016  . Chronic fatigue syndrome with fibromyalgia 08/10/2016  . Diabetes mellitus type 2, uncontrolled, without complications ([place]) 56/81/2751  . Insomnia 10/21/2015  . Anxiety disorder, unspecified 10/21/2015  . Hypothyroidism due to acquired atrophy of thyroid 10/21/2015  . Panic attacks 10/21/2015    Past Medical History:  Diagnosis Date  . Anemia   . Chronic fatigue syndrome with fibromyalgia 08/10/2016  . Diabetes mellitus type 2, uncontrolled, without complications ([place] [facility])   . Panic attacks 10/21/2015  . Thyroid disease     Past Surgical History:  Procedure Laterality Date  . Enometrial    . POLYPECTOMY     Uterus    Social History   Tobacco Use  . Smoking status: Never Smoker  . Smokeless tobacco: Current User  Substance Use Topics  . Alcohol use: No  . Drug use: No    Family History  Problem Relation Age of Onset  . Hypertension Mother   . Diabetes Father     No Known Allergies  Medication list has been reviewed and updated.  Current Outpatient Medications on File Prior to Visit  Medication Sig  Dispense Refill  . ALPRAZolam (XANAX) 1 MG tablet Take 1/2 or 1 tablet daily if needed for panic attack.  Do not combine with klonpin 20 tablet 0  . amphetamine-dextroamphetamine (ADDERALL) 10 MG tablet Take 1 tablet (10 mg total) by mouth 2 (two) times daily with a meal. Ok to fill 30 days after rx 60 tablet 0  . ARMOUR THYROID 90 MG tablet TAKE 1 TABLET BY MOUTH ONCE DAILY BEFORE BREAKFAST 90 tablet 3  . buPROPion (WELLBUTRIN SR) 100 MG 12 hr tablet Take 100 mg by mouth.    Marland Kitchen buPROPion (WELLBUTRIN SR) 150 MG 12 hr tablet Take 1 tablet (150 mg total) by mouth 2 (two) times daily. (Patient taking differently: Take 150 mg by mouth daily. ) 60 tablet 5  . clonazePAM (KLONOPIN) 1 MG tablet TAKE 1 TABLET BY MOUTH 2 TIMES DAILY AS NEEDED FOR ANXIETY. 60 tablet 1  . cyclobenzaprine (FLEXERIL) 10 MG tablet TAKE 1/2 TO 1 (ONE-HALF TO ONE) TABLET BY MOUTH TWICE DAILY AS NEEDED FOR MUSCLE SPASM 60 tablet 1  . levonorgestrel-ethinyl estradiol (AVIANE,ALESSE,LESSINA) 0.1-20 MG-MCG tablet Take 1 tablet by mouth daily. Pt gets in [place] OTC 1 Package 11  . metFORMIN (GLUCOPHAGE) 1000 MG tablet Take 1 tablet (1,000 mg total) by mouth 2 (two) times daily with a meal. 180 tablet 3  . rosuvastatin (CRESTOR) 20 MG tablet Take 1 tablet (20 mg total)  by mouth daily. 90 tablet 3  . sertraline (ZOLOFT) 100 MG tablet 100 mg.    . sitaGLIPtin (JANUVIA) 100 MG tablet TAKE 1 TABLET BY MOUTH ONCE DAILY 90 tablet 3   No current facility-administered medications on file prior to visit.     Review of Systems:  As per HPI- otherwise negative. No fever or chills   Physical Examination: Vitals:   03/11/18 1511  BP: 112/70  Pulse: 93  Resp: 16  Temp: 98.1 F (36.7 C)  SpO2: 98%   Vitals:   03/11/18 1511  Weight: 180 lb (81.6 kg)  Height: 5' 1.5" (1.562 m)   Body mass index is 33.46 kg/m. Ideal Body Weight: Weight in (lb) to have BMI = 25: 134.2   GEN: WDWN, NAD, Non-toxic, Alert & Oriented x 3 HEENT:  Atraumatic, Normocephalic.  Ears and Nose: No external deformity. EXTR: No clubbing/cyanosis/edema NEURO: Normal gait.  Eczema on the dorsum of the right hand  PSYCH: Normally interactive. Conversant. Not depressed or anxious appearing.  Calm demeanor.  Left leg- there is a caper sized pedunculated flesh colored skin lesion present.  It is just under 0.5cm in size.  Plan to remove for therapeutic reason as it catches on clothing.  Will also send to path to ensure benign  VC obtained.  Prepped with betadine and alcohol, anesthesia with lido and epi 27ml Removed with dermablade and specimen to pathology 48ml EBL Silver nitrate for hemostasis and band- aid  Pt tolerated well, no complications   Assessment and Plan:Eczema, unspecified type - Plan: triamcinolone ointment (KENALOG) 0.1 %  Nodule of skin of lower extremity - Plan: Dermatology pathology  Removed skin nodule and sent to pathology Triamcinolone for eczema   Signed Lamar Blinks, MD

## 2018-03-11 ENCOUNTER — Encounter: Payer: Self-pay | Admitting: Family Medicine

## 2018-03-11 ENCOUNTER — Ambulatory Visit (INDEPENDENT_AMBULATORY_CARE_PROVIDER_SITE_OTHER): Payer: 59 | Admitting: Family Medicine

## 2018-03-11 VITALS — BP 112/70 | HR 93 | Temp 98.1°F | Resp 16 | Ht 61.5 in | Wt 180.0 lb

## 2018-03-11 DIAGNOSIS — L919 Hypertrophic disorder of the skin, unspecified: Secondary | ICD-10-CM | POA: Diagnosis not present

## 2018-03-11 DIAGNOSIS — L918 Other hypertrophic disorders of the skin: Secondary | ICD-10-CM

## 2018-03-11 DIAGNOSIS — L309 Dermatitis, unspecified: Secondary | ICD-10-CM

## 2018-03-11 DIAGNOSIS — R2240 Localized swelling, mass and lump, unspecified lower limb: Secondary | ICD-10-CM

## 2018-03-11 MED ORDER — TRIAMCINOLONE ACETONIDE 0.1 % EX OINT: 1.0000 "application " | TOPICAL_OINTMENT | Freq: Two times a day (BID) | CUTANEOUS | 0 refills | Status: DC

## 2018-03-11 NOTE — Patient Instructions (Addendum)
Good to see you today!  I will be in touch with your pathology report asap Keep the skin area clean and dry until tomorrow.  Then ok to bathe as usual, keep covered with a band- aid as needed If any sign of infection such as redness, pain, pus please let me know right away!   Use the triamcinolone as needed for your hands

## 2018-03-13 ENCOUNTER — Encounter: Payer: Self-pay | Admitting: Family Medicine

## 2018-03-15 ENCOUNTER — Telehealth: Payer: Self-pay | Admitting: *Deleted

## 2018-03-15 NOTE — Telephone Encounter (Signed)
Received Dermatopathology Report results from [facility]; forwarded to provider/SLS 10/11

## 2018-05-16 DIAGNOSIS — M531 Cervicobrachial syndrome: Secondary | ICD-10-CM | POA: Diagnosis not present

## 2018-05-16 DIAGNOSIS — M9902 Segmental and somatic dysfunction of thoracic region: Secondary | ICD-10-CM | POA: Diagnosis not present

## 2018-05-16 DIAGNOSIS — M9901 Segmental and somatic dysfunction of cervical region: Secondary | ICD-10-CM | POA: Diagnosis not present

## 2018-05-20 DIAGNOSIS — M545 Low back pain: Secondary | ICD-10-CM | POA: Diagnosis not present

## 2018-05-20 DIAGNOSIS — M9902 Segmental and somatic dysfunction of thoracic region: Secondary | ICD-10-CM | POA: Diagnosis not present

## 2018-05-20 DIAGNOSIS — M4804 Spinal stenosis, thoracic region: Secondary | ICD-10-CM | POA: Diagnosis not present

## 2018-05-20 DIAGNOSIS — M531 Cervicobrachial syndrome: Secondary | ICD-10-CM | POA: Diagnosis not present

## 2018-05-20 DIAGNOSIS — M9901 Segmental and somatic dysfunction of cervical region: Secondary | ICD-10-CM | POA: Diagnosis not present

## 2018-05-21 ENCOUNTER — Other Ambulatory Visit: Payer: Self-pay | Admitting: Family Medicine

## 2018-05-21 DIAGNOSIS — R252 Cramp and spasm: Secondary | ICD-10-CM

## 2018-05-21 DIAGNOSIS — S43402A Unspecified sprain of left shoulder joint, initial encounter: Secondary | ICD-10-CM

## 2018-05-22 DIAGNOSIS — M531 Cervicobrachial syndrome: Secondary | ICD-10-CM | POA: Diagnosis not present

## 2018-05-22 DIAGNOSIS — M9902 Segmental and somatic dysfunction of thoracic region: Secondary | ICD-10-CM | POA: Diagnosis not present

## 2018-05-22 DIAGNOSIS — M9901 Segmental and somatic dysfunction of cervical region: Secondary | ICD-10-CM | POA: Diagnosis not present

## 2018-06-04 ENCOUNTER — Other Ambulatory Visit: Payer: Self-pay | Admitting: Family Medicine

## 2018-06-04 DIAGNOSIS — L309 Dermatitis, unspecified: Secondary | ICD-10-CM

## 2018-06-04 MED ORDER — TRIAMCINOLONE ACETONIDE 0.1 % EX OINT: 1.0000 "application " | TOPICAL_OINTMENT | Freq: Two times a day (BID) | CUTANEOUS | 0 refills | Status: DC

## 2018-07-31 ENCOUNTER — Telehealth: Payer: Self-pay

## 2018-07-31 NOTE — Telephone Encounter (Signed)
PA approved through 08/01/2019.

## 2018-07-31 NOTE — Telephone Encounter (Signed)
PA initiated via Covermymeds; KEY: [id number]. Awaiting determination.

## 2018-08-14 DIAGNOSIS — E119 Type 2 diabetes mellitus without complications: Secondary | ICD-10-CM | POA: Diagnosis not present

## 2018-08-14 DIAGNOSIS — Z1231 Encounter for screening mammogram for malignant neoplasm of breast: Secondary | ICD-10-CM | POA: Diagnosis not present

## 2018-08-14 DIAGNOSIS — Z124 Encounter for screening for malignant neoplasm of cervix: Secondary | ICD-10-CM | POA: Diagnosis not present

## 2018-08-14 DIAGNOSIS — Z01419 Encounter for gynecological examination (general) (routine) without abnormal findings: Secondary | ICD-10-CM | POA: Diagnosis not present

## 2018-10-07 ENCOUNTER — Telehealth: Payer: Self-pay

## 2018-10-07 NOTE — Telephone Encounter (Signed)
Patient scheduled for wed

## 2018-10-07 NOTE — Telephone Encounter (Signed)
Copied from [place] [phone]. Topic: General - Other >> Oct 07, 2018 10:03 AM Carolyn Stare wrote:  Pt return call about making an appt, could not get through, pt said she put a block on her phone so just schedule the appt and she will see it in [place]

## 2018-10-08 NOTE — Progress Notes (Signed)
[employer] [facility] [address], [place], [place] [address] [phone]  Date:  10/09/2018   Name:  Sherri Cross   DOB:  1970/09/20   MRN:  [id number]  PCP:  Darreld Mclean, MD    Chief Complaint: No chief complaint on file.   History of Present Illness:  Sherri Cross is a 48 y.o. very pleasant female patient who presents with the following:  Virtual recheck visit today, due to pandemic Patient location is home Provider location is home Patient ID confirmed with 2 identifiers, she gives consent for virtual visit today Last visit with me was in October for a skin procedure, last physical in September  History of diabetes, anxiety/insomnia/chronic fatigue/arthralgia, hypothyroidism She is seeing psychiatry for depression.  She was doing "[place]" therapy at last visit; this was helping but she could not afford to continue this  She is also under GYN care  Today pt states that she wants to give me a brief medical history covering the last 18 years.  She states that she has been chronically under a lot of stress, and that she started to have allergies and frequent UTI when she was younger.  These issues have gotten better, but she continues to suffer from chronic depression "it took me 6 years to get my thyroid normal" She again mentions that her family is a big stressor to her.  She complains that her father is bipolar and that this is a big problem for her She states that most people in her family were dx with DM between age 45 and 52-she wonders why she was diagnosed in her 36s She states that with her diabetes she expects to be dead by age 56  Xanax as needed Adderall 10 twice daily Wellbutrin 150 twice daily Armour Thyroid 90 OCP Januvia 100 Metformin 1000 twice daily Crestor 20 Zoloft 100  Eye exam: her appt got delayed due to pandemic A1c is due- however pt reports that this was done by her GYN in March  at it was 7.2% Immunizations are up-to-date She is due for A1c, lipids We increased her metformin slightly upon receipt of previous A1c Also changed from lovastatin to Crestor She made both of these changes and did ok She is concerned that her A1c may have gone up, as her diet has not been good as good during pandemic  Lab Results  Component Value Date   HGBA1C 7.8 (H) 02/27/2018   Lab Results  Component Value Date   TSH 0.42 02/27/2018    Patient Active Problem List   Diagnosis Date Noted  . Anxiety hyperventilation 11/05/2016  . Psychophysiological insomnia 11/05/2016  . Chronic fatigue syndrome with fibromyalgia 08/10/2016  . Diabetes mellitus type 2, uncontrolled, without complications ([place]) 47/42/5956  . Insomnia 10/21/2015  . Anxiety disorder, unspecified 10/21/2015  . Hypothyroidism due to acquired atrophy of thyroid 10/21/2015  . Panic attacks 10/21/2015    Past Medical History:  Diagnosis Date  . Anemia   . Chronic fatigue syndrome with fibromyalgia 08/10/2016  . Diabetes mellitus type 2, uncontrolled, without complications ([place])   . Panic attacks 10/21/2015  . Thyroid disease     Past Surgical History:  Procedure Laterality Date  . Enometrial    . POLYPECTOMY     Uterus    Social History   Tobacco Use  . Smoking status: Never Smoker  . Smokeless tobacco: Current User  Substance Use Topics  .  Alcohol use: No  . Drug use: No    Family History  Problem Relation Age of Onset  . Hypertension Mother   . Diabetes Father     No Known Allergies  Medication list has been reviewed and updated.  Current Outpatient Medications on File Prior to Visit  Medication Sig Dispense Refill  . ALPRAZolam (XANAX) 1 MG tablet Take 1/2 or 1 tablet daily if needed for panic attack.  Do not combine with klonpin 20 tablet 0  . amphetamine-dextroamphetamine (ADDERALL) 10 MG tablet Take 1 tablet (10 mg total) by mouth 2 (two) times daily with a meal. Ok to fill 30 days  after rx 60 tablet 0  . ARMOUR THYROID 90 MG tablet TAKE 1 TABLET BY MOUTH ONCE DAILY BEFORE BREAKFAST 90 tablet 3  . buPROPion (WELLBUTRIN SR) 100 MG 12 hr tablet Take 100 mg by mouth.    Marland Kitchen buPROPion (WELLBUTRIN SR) 150 MG 12 hr tablet Take 1 tablet (150 mg total) by mouth 2 (two) times daily. (Patient taking differently: Take 150 mg by mouth daily. ) 60 tablet 5  . clonazePAM (KLONOPIN) 1 MG tablet TAKE 1 TABLET BY MOUTH 2 TIMES DAILY AS NEEDED FOR ANXIETY. 60 tablet 1  . cyclobenzaprine (FLEXERIL) 10 MG tablet TAKE 1/2 TO 1 TABLET BY  MOUTH 2 TIMES DAILY AS  NEEDED FOR MUSCLE SPASM 60 tablet 1  . levonorgestrel-ethinyl estradiol (AVIANE,ALESSE,LESSINA) 0.1-20 MG-MCG tablet Take 1 tablet by mouth daily. Pt gets in [place] OTC 1 Package 11  . metFORMIN (GLUCOPHAGE) 1000 MG tablet Take 1 tablet (1,000 mg total) by mouth 2 (two) times daily with a meal. 180 tablet 3  . rosuvastatin (CRESTOR) 20 MG tablet Take 1 tablet (20 mg total) by mouth daily. 90 tablet 3  . sertraline (ZOLOFT) 100 MG tablet 100 mg.    . sitaGLIPtin (JANUVIA) 100 MG tablet TAKE 1 TABLET BY MOUTH ONCE DAILY 90 tablet 3  . triamcinolone ointment (KENALOG) 0.1 % Apply 1 application topically 2 (two) times daily. 30 g 0   No current facility-administered medications on file prior to visit.     Review of Systems:  As per HPI- otherwise negative. No SI, she cites her religion and her mom as reasons why she would not self- harm   Physical Examination: There were no vitals filed for this visit. There were no vitals filed for this visit. There is no height or weight on file to calculate BMI. Ideal Body Weight:    Pt observed over video today She appears her normal self, continues to be very negative about her life and situation. She does not check her BP at home, but this has never been an issue for her She does not generally check her glucose at home Assessment and Plan: Uncontrolled type 2 diabetes mellitus without  complication, without long-term current use of insulin ([place])  Hypothyroidism due to acquired atrophy of thyroid  Dyslipidemia  Dysthymia  Virtual visit today for chronic health issues.  Discussed her recent A1c.  It was actually improved, close to goal.  I advised her that with her current diabetes, I certainly do not expect her to die in the next 10 years.  She is surprised and somewhat reassured. She is continue to take her prescribed medications for diabetes, lipids, thyroid I asked her to come and see me in July for routine visit and labs  As is typical for this patient, she seems to have an extremely negative outlook about life.  She seems  to feel that her family situation controls her, and that she is not able to change this situation although she has an independent adult.  She is under the care of psychiatry, and denies any suicidal ideation  Send her the following my chart message-  It was nice to see you today!  Please do come and see me in July for an office visit and labs It sounds like your diabetes is actually under ok control!  Please do work on getting your diet on track, and try to walk outdoors for at least 30 minutes a day  Signed Lamar Blinks, MD

## 2018-10-09 ENCOUNTER — Other Ambulatory Visit: Payer: Self-pay

## 2018-10-09 ENCOUNTER — Ambulatory Visit (INDEPENDENT_AMBULATORY_CARE_PROVIDER_SITE_OTHER): Payer: 59 | Admitting: Family Medicine

## 2018-10-09 ENCOUNTER — Encounter: Payer: Self-pay | Admitting: Family Medicine

## 2018-10-09 DIAGNOSIS — E1165 Type 2 diabetes mellitus with hyperglycemia: Secondary | ICD-10-CM | POA: Diagnosis not present

## 2018-10-09 DIAGNOSIS — F341 Dysthymic disorder: Secondary | ICD-10-CM | POA: Diagnosis not present

## 2018-10-09 DIAGNOSIS — E785 Hyperlipidemia, unspecified: Secondary | ICD-10-CM | POA: Diagnosis not present

## 2018-10-09 DIAGNOSIS — E034 Atrophy of thyroid (acquired): Secondary | ICD-10-CM

## 2018-10-09 DIAGNOSIS — IMO0001 Reserved for inherently not codable concepts without codable children: Secondary | ICD-10-CM

## 2018-10-09 NOTE — Patient Instructions (Addendum)
It was nice to see you today!  Please do come and see me in July for an office visit and labs It sounds like your diabetes is actually under ok control!  Please do work on getting your diet on track, and try to walk outdoors for at least 30 minutes a day

## 2018-11-04 ENCOUNTER — Other Ambulatory Visit: Payer: Self-pay | Admitting: Obstetrics and Gynecology

## 2018-11-06 ENCOUNTER — Other Ambulatory Visit: Payer: Self-pay | Admitting: Obstetrics and Gynecology

## 2018-11-06 DIAGNOSIS — N6310 Unspecified lump in the right breast, unspecified quadrant: Secondary | ICD-10-CM

## 2018-11-12 ENCOUNTER — Encounter: Payer: Self-pay | Admitting: Family Medicine

## 2018-11-12 ENCOUNTER — Other Ambulatory Visit: Payer: 59

## 2018-12-04 ENCOUNTER — Ambulatory Visit: Payer: 59 | Admitting: Family Medicine

## 2018-12-04 ENCOUNTER — Telehealth: Payer: Self-pay | Admitting: Family Medicine

## 2018-12-04 NOTE — Telephone Encounter (Signed)
Called PT left msg to call back for appt 8-4 Mon-Friday

## 2018-12-19 ENCOUNTER — Other Ambulatory Visit: Payer: Self-pay | Admitting: Family Medicine

## 2018-12-19 DIAGNOSIS — E034 Atrophy of thyroid (acquired): Secondary | ICD-10-CM

## 2018-12-28 ENCOUNTER — Other Ambulatory Visit: Payer: Self-pay | Admitting: Family Medicine

## 2018-12-28 DIAGNOSIS — E119 Type 2 diabetes mellitus without complications: Secondary | ICD-10-CM

## 2019-01-05 ENCOUNTER — Other Ambulatory Visit: Payer: Self-pay | Admitting: Family Medicine

## 2019-01-05 DIAGNOSIS — IMO0001 Reserved for inherently not codable concepts without codable children: Secondary | ICD-10-CM

## 2019-03-07 ENCOUNTER — Other Ambulatory Visit: Payer: Self-pay | Admitting: Obstetrics and Gynecology

## 2019-03-07 ENCOUNTER — Other Ambulatory Visit: Payer: Self-pay | Admitting: Family Medicine

## 2019-03-07 DIAGNOSIS — R252 Cramp and spasm: Secondary | ICD-10-CM

## 2019-03-07 DIAGNOSIS — E785 Hyperlipidemia, unspecified: Secondary | ICD-10-CM

## 2019-03-07 DIAGNOSIS — S43402A Unspecified sprain of left shoulder joint, initial encounter: Secondary | ICD-10-CM

## 2019-03-10 ENCOUNTER — Ambulatory Visit: Payer: Self-pay | Admitting: *Deleted

## 2019-03-10 NOTE — Telephone Encounter (Signed)
    2  Sherri Cross "[name]" Female, 48 y.o., [birth year] MRN:  [id number] Phone:  [phone] Jerilynn Mages) PCP:  Darreld Mclean, MD [facility] Other Message from Jodie Echevaria sent at 03/10/2019 4:32 PM EDT  Patient called to say that her that her throat is sore but not like a cold sore said she had a ear ache first but then she woke up with the soreness painful to talk, swallow and to the touch. Need to know what to do Ph# [phone]   Call History   Type Contact Phone User  03/10/2019 04:30 PM EDT Phone (Incoming) Hegg, Desere P "[name]" (Self) [phone] Lemmie Evens) Leward Quan A   Complains of sore throat with white spots at the back on both sides. Outside of neck is sensitive to touch. Able to eat drink and swallow. No fever. Had left ear pain recently but it has eased off. Would like to be seen tomorrow. Reviewed Care Advice with patient. Transferred for appointment. Reason for Disposition . [1] Sore throat is the only symptom AND [2] present > 48 hours  Answer Assessment - Initial Assessment Questions 1. ONSET: "When did the throat start hurting?" (Hours or days ago)     This morning 2. SEVERITY: "How bad is the sore throat?" (Scale 1-10; mild, moderate or severe)   - MILD (1-3):  doesn't interfere with eating or normal activities   - MODERATE (4-7): interferes with eating some solids and normal activities   - SEVERE (8-10):  excruciating pain, interferes with most normal activities   - SEVERE DYSPHAGIA: can't swallow liquids, drooling    mild 3. STREP EXPOSURE: "Has there been any exposure to strep within the past week?" If so, ask: "What type of contact occurred?"      no 4.  VIRAL SYMPTOMS: "Are there any symptoms of a cold, such as a runny nose, cough, hoarse voice or red eyes?"       5. FEVER: "Do you have a fever?" If so, ask: "What is your temperature, how was it measured, and when did it start?"     no 6. PUS ON THE  TONSILS: "Is there pus on the tonsils in the back of your throat?"     Yes, both sides 7. OTHER SYMPTOMS: "Do you have any other symptoms?" (e.g., difficulty breathing, headache, rash)     no 8. PREGNANCY: "Is there any chance you are pregnant?" "When was your last menstrual period?"     no  Protocols used: SORE THROAT-A-AH

## 2019-03-11 ENCOUNTER — Ambulatory Visit (INDEPENDENT_AMBULATORY_CARE_PROVIDER_SITE_OTHER): Payer: 59 | Admitting: Internal Medicine

## 2019-03-11 ENCOUNTER — Other Ambulatory Visit: Payer: Self-pay

## 2019-03-11 DIAGNOSIS — L309 Dermatitis, unspecified: Secondary | ICD-10-CM

## 2019-03-11 DIAGNOSIS — M542 Cervicalgia: Secondary | ICD-10-CM | POA: Diagnosis not present

## 2019-03-11 MED ORDER — TRIAMCINOLONE ACETONIDE 0.1 % EX OINT: 1.0000 "application " | TOPICAL_OINTMENT | Freq: Two times a day (BID) | CUTANEOUS | 0 refills | Status: AC

## 2019-03-11 NOTE — Telephone Encounter (Signed)
Patient has virtual appointment with Dr. Larose Kells today.

## 2019-03-11 NOTE — Progress Notes (Signed)
Subjective:    Patient ID: Sherri Cross, female    DOB: August 04, 1970, 48 y.o.   MRN: [id number]  DOS:  03/11/2019 Type of visit - description: Virtual Visit via Video Note  I connected with@   by a video enabled telemedicine application and verified that I am speaking with the correct person using two identifiers.   THIS ENCOUNTER IS A VIRTUAL VISIT DUE TO COVID-19 - PATIENT WAS NOT SEEN IN THE OFFICE. PATIENT HAS CONSENTED TO VIRTUAL VISIT / TELEMEDICINE VISIT   Location of patient: home  Location of provider: office  I discussed the limitations of evaluation and management by telemedicine and the availability of in person appointments. The patient expressed understanding and agreed to proceed.  History of Present Illness:  Acute 1 day history of sore throat.  Located behind the voice box, not at the pharynx. Pain is worse when she swallows. States the pain is different to a cold or URI. Today is much improved but she remains somewhat concerned.  Also, request a refill of her topical steroid, she uses it at the right hand, rash is better but not completely gone.   Review of Systems Denies fever chills No nausea or vomiting No heartburn No rash on the neck. No hoarseness No lump on self palpation of the neck.   Past Medical History:  Diagnosis Date  . Anemia   . Chronic fatigue syndrome with fibromyalgia 08/10/2016  . Diabetes mellitus type 2, uncontrolled, without complications ([place])   . Panic attacks 10/21/2015  . Thyroid disease     Past Surgical History:  Procedure Laterality Date  . Enometrial    . POLYPECTOMY     Uterus    Social History   Socioeconomic History  . Marital status: Married    Spouse name: Not on file  . Number of children: Not on file  . Years of education: Not on file  . Highest education level: Not on file  Occupational History  . Not on file  Social Needs  . Financial resource strain: Not hard at all  . Food insecurity   Worry: Never true    Inability: Never true  . Transportation needs    Medical: Yes    Non-medical: Yes  Tobacco Use  . Smoking status: Never Smoker  . Smokeless tobacco: Current User  Substance and Sexual Activity  . Alcohol use: No  . Drug use: No  . Sexual activity: Yes  Lifestyle  . Physical activity    Days per week: 0 days    Minutes per session: 0 min  . Stress: Very much  Relationships  . Social Herbalist on phone: Once a week    Gets together: Never    Attends religious service: Never    Active member of club or organization: No    Attends meetings of clubs or organizations: Never    Relationship status: Married  . Intimate partner violence    Fear of current or ex partner: No    Emotionally abused: No    Physically abused: No    Forced sexual activity: No  Other Topics Concern  . Not on file  Social History Narrative  . Not on file      Allergies as of 03/11/2019   No Known Allergies     Medication List       Accurate as of March 11, 2019 11:56 AM. If you have any questions, ask your nurse or doctor.  ALPRAZolam 1 MG tablet Commonly known as: XANAX Take 1/2 or 1 tablet daily if needed for panic attack.  Do not combine with klonpin   amphetamine-dextroamphetamine 10 MG tablet Commonly known as: ADDERALL Take 1 tablet (10 mg total) by mouth 2 (two) times daily with a meal. Ok to fill 30 days after rx   Armour Thyroid 90 MG tablet Generic drug: thyroid TAKE 1 TABLET BY MOUTH ONCE DAILY BEFORE BREAKFAST   buPROPion 150 MG 12 hr tablet Commonly known as: WELLBUTRIN SR Take 1 tablet (150 mg total) by mouth 2 (two) times daily. What changed: when to take this   buPROPion 100 MG 12 hr tablet Commonly known as: WELLBUTRIN SR Take 100 mg by mouth. What changed: Another medication with the same name was changed. Make sure you understand how and when to take each.   clonazePAM 1 MG tablet Commonly known as: KLONOPIN TAKE 1 TABLET  BY MOUTH 2 TIMES DAILY AS NEEDED FOR ANXIETY.   cyclobenzaprine 10 MG tablet Commonly known as: FLEXERIL TAKE 1/2 TO 1 TABLET BY  MOUTH 2 TIMES DAILY AS  NEEDED FOR MUSCLE SPASM   levonorgestrel-ethinyl estradiol 0.1-20 MG-MCG tablet Commonly known as: ALESSE Take 1 tablet by mouth daily. Pt gets in [place] OTC   metFORMIN 1000 MG tablet Commonly known as: GLUCOPHAGE TAKE 1 TABLET BY MOUTH TWO  TIMES DAILY WITH MEALS   rosuvastatin 20 MG tablet Commonly known as: CRESTOR TAKE 1 TABLET BY MOUTH  DAILY   sertraline 100 MG tablet Commonly known as: ZOLOFT 100 mg.   sitaGLIPtin 100 MG tablet Commonly known as: Januvia TAKE 1 TABLET BY MOUTH ONCE DAILY   triamcinolone ointment 0.1 % Commonly known as: KENALOG Apply 1 application topically 2 (two) times daily.           Objective:   Physical Exam There were no vitals taken for this visit. This is a virtual video visit, she is alert oriented x3, face and neck looks symmetric, voice is normal.    Assessment    48 year old female, history of DM, depression, thyroid disease, anxiety, presents with Anterior neck pain: The patient knows the limitations of a virtual visit, overall pain has decreased substantially since yesterday, no red flag symptoms, could be GERD although she does not have heartburn. Very low suspicion for COVID-19. We agreed on observation, if she is not better in the next 2 to 3 days or if she develop over symptoms she will contact us.  Will reassess the situation and possibly prescribe a COVID-19 test. Rash: Refill for triamcinolone sent but strongly recommend the patient to see PCP if the rash is ongoing.     I discussed the assessment and treatment plan with the patient. The patient was provided an opportunity to ask questions and all were answered. The patient agreed with the plan and demonstrated an understanding of the instructions.   The patient was advised to call back or seek an in-person evaluation  if the symptoms worsen or if the condition fails to improve as anticipated.

## 2019-04-03 ENCOUNTER — Ambulatory Visit: Payer: 59

## 2019-04-08 ENCOUNTER — Ambulatory Visit (INDEPENDENT_AMBULATORY_CARE_PROVIDER_SITE_OTHER): Payer: 59

## 2019-04-08 ENCOUNTER — Encounter (HOSPITAL_COMMUNITY): Payer: Self-pay

## 2019-04-08 ENCOUNTER — Other Ambulatory Visit: Payer: Self-pay

## 2019-04-08 DIAGNOSIS — Z23 Encounter for immunization: Secondary | ICD-10-CM

## 2019-04-08 NOTE — Patient Instructions (Addendum)
YOU ARE SCHEDULED FOR A COVID TEST _________@____________ . THIS TEST MUST BE DONE BEFORE SURGERY. GO TO  [address], [place], [address] AND REMAIN IN YOUR CAR, THIS IS A DRIVE UP TEST. ONCE YOUR COVID TEST IS DONE PLEASE FOLLOW ALL THE QUARANTINE  INSTRUCTIONS GIVEN IN YOUR HANDOUT.      Your procedure is scheduled on 04-14-19  Report to [facility]   Call this number if you have problems the morning of surgery  :[phone].   OUR ADDRESS IS [place].  WE ARE LOCATED IN [facility].                                     REMEMBER:  DO NOT EAT FOOD OR DRINK LIQUIDS AFTER MIDNIGHT .  YOU MAY  BRUSH YOUR TEETH MORNING OF SURGERY AND RINSE YOUR MOUTH OUT, NO CHEWING GUM CANDY OR MINTS.   TAKE THESE MEDICATIONS MORNING OF SURGERY WITH A SIP OF WATER:  _No DM meds _____thyriod pill, lamicital____________________________  IF YOU ARE SPENDING THE NIGHT AFTER SURGERY PLEASE BRING ALL YOUR PRESCRIPTION MEDICATIONS IN THEIR ORIGINAL BOTTLES. 1 VISITOR IS ALLOWED IN WAITING ROOM ONLY DAY OF SURGERY. NO VISITOR MAY SPEND THE NIGHT. VISITOR ARE ALLOWED TO STAY UNTIL 800 PM.                                    DO NOT WEAR JEWERLY, MAKE UP, OR NAIL POLISH ON FINGERNAILS. DO NOT WEAR LOTIONS, POWDERS, PERFUMES OR DEODORANT. DO NOT SHAVE FOR 24 HOURS PRIOR TO DAY OF SURGERY.  CONTACTS, GLASSES, OR DENTURES MAY NOT BE WORN TO SURGERY.                                    [facility] IS NOT RESPONSIBLE  FOR ANY BELONGINGS.                                                                    Marland Kitchen                                   Incentive Spirometer  An incentive spirometer is a tool that can help keep your lungs clear and active. This tool measures how well you are filling your lungs with each breath. Taking long deep breaths may help reverse or decrease the chance of developing breathing (pulmonary) problems (especially  infection) following:  A long period of time when you are unable to move or be active. BEFORE THE PROCEDURE   If the spirometer includes an indicator to show your best effort, your nurse or respiratory therapist will set it to a desired goal.  If possible, sit up straight or lean slightly forward. Try not to slouch.  Hold the incentive spirometer  in an upright position. INSTRUCTIONS FOR USE  1. Sit on the edge of your bed if possible, or sit up as far as you can in bed or on a chair. 2. Hold the incentive spirometer in an upright position. 3. Breathe out normally. 4. Place the mouthpiece in your mouth and seal your lips tightly around it. 5. Breathe in slowly and as deeply as possible, raising the piston or the ball toward the top of the column. 6. Hold your breath for 3-5 seconds or for as long as possible. Allow the piston or ball to fall to the bottom of the column. 7. Remove the mouthpiece from your mouth and breathe out normally. 8. Rest for a few seconds and repeat Steps 1 through 7 at least 10 times every 1-2 hours when you are awake. Take your time and take a few normal breaths between deep breaths. 9. The spirometer may include an indicator to show your best effort. Use the indicator as a goal to work toward during each repetition. 10. After each set of 10 deep breaths, practice coughing to be sure your lungs are clear. If you have an incision (the cut made at the time of surgery), support your incision when coughing by placing a pillow or rolled up towels firmly against it. Once you are able to get out of bed, walk around indoors and cough well. You may stop using the incentive spirometer when instructed by your caregiver.  RISKS AND COMPLICATIONS  Take your time so you do not get dizzy or light-headed.  If you are in pain, you may need to take or ask for pain medication before doing incentive spirometry. It is harder to take a deep breath if you are having pain. AFTER  USE  Rest and breathe slowly and easily.  It can be helpful to keep track of a log of your progress. Your caregiver can provide you with a simple table to help with this. If you are using the spirometer at home, follow these instructions: [place] IF:   You are having difficultly using the spirometer.  You have trouble using the spirometer as often as instructed.  Your pain medication is not giving enough relief while using the spirometer.  You develop fever of 100.5 F (38.1 C) or higher. SEEK IMMEDIATE MEDICAL CARE IF:   You cough up bloody sputum that had not been present before.  You develop fever of 102 F (38.9 C) or greater.  You develop worsening pain at or near the incision site. MAKE SURE YOU:   Understand these instructions.  Will watch your condition.  Will get help right away if you are not doing well or get worse. Document Released: 10/02/2006 Document Revised: 08/14/2011 Document Reviewed: 12/03/2006 ExitCare Patient Information 2014 [employer].   ________________________________________________________________________       [facility] - Preparing for Surgery Before surgery, you can play an important role.  Because skin is not sterile, your skin needs to be as free of germs as possible.  You can reduce the number of germs on your skin by washing with CHG (chlorahexidine gluconate) soap before surgery.  CHG is an antiseptic cleaner which kills germs and bonds with the skin to continue killing germs even after washing. Please DO NOT use if you have an allergy to CHG or antibacterial soaps.  If your skin becomes reddened/irritated stop using the CHG and inform your nurse when you arrive at Short Stay. Do not shave (including legs and underarms) for  at least 48 hours prior to the first CHG shower.  You may shave your face/neck. Please follow these instructions carefully:  1.  Shower with CHG Soap the night before surgery and the  morning of  Surgery.  2.  If you choose to wash your hair, wash your hair first as usual with your  normal  shampoo.  3.  After you shampoo, rinse your hair and body thoroughly to remove the  shampoo.                           4.  Use CHG as you would any other liquid soap.  You can apply chg directly  to the skin and wash                       Gently with a scrungie or clean washcloth.  5.  Apply the CHG Soap to your body ONLY FROM THE NECK DOWN.   Do not use on face/ open                           Wound or open sores. Avoid contact with eyes, ears mouth and genitals (private parts).                       Wash face,  Genitals (private parts) with your normal soap.             6.  Wash thoroughly, paying special attention to the area where your surgery  will be performed.  7.  Thoroughly rinse your body with warm water from the neck down.  8.  DO NOT shower/wash with your normal soap after using and rinsing off  the CHG Soap.                9.  Pat yourself dry with a clean towel.            10.  Wear clean pajamas.            11.  Place clean sheets on your bed the night of your first shower and do not  sleep with pets. Day of Surgery : Do not apply any lotions/deodorants the morning of surgery.  Please wear clean clothes to the hospital/surgery center.  FAILURE TO FOLLOW THESE INSTRUCTIONS MAY RESULT IN THE CANCELLATION OF YOUR SURGERY PATIENT SIGNATURE_________________________________  NURSE SIGNATURE__________________________________  ________________________________________________________________________

## 2019-04-09 ENCOUNTER — Encounter (HOSPITAL_COMMUNITY): Payer: 59

## 2019-04-10 ENCOUNTER — Other Ambulatory Visit: Payer: Self-pay

## 2019-04-10 ENCOUNTER — Encounter (HOSPITAL_COMMUNITY)
Admission: RE | Admit: 2019-04-10 | Discharge: 2019-04-10 | Disposition: A | Discharge status: Home / Self Care | Payer: 59 | Source: Ambulatory Visit | Attending: Obstetrics and Gynecology | Admitting: Obstetrics and Gynecology

## 2019-04-10 ENCOUNTER — Other Ambulatory Visit (HOSPITAL_COMMUNITY)
Admission: RE | Admit: 2019-04-10 | Discharge: 2019-04-10 | Disposition: A | Discharge status: Home / Self Care | Payer: 59 | Source: Ambulatory Visit | Attending: Obstetrics and Gynecology | Admitting: Obstetrics and Gynecology

## 2019-04-10 ENCOUNTER — Encounter (HOSPITAL_COMMUNITY): Payer: Self-pay

## 2019-04-10 DIAGNOSIS — R000 Tachycardia, unspecified: Secondary | ICD-10-CM | POA: Insufficient documentation

## 2019-04-10 DIAGNOSIS — N920 Excessive and frequent menstruation with regular cycle: Secondary | ICD-10-CM | POA: Diagnosis not present

## 2019-04-10 DIAGNOSIS — Z01818 Encounter for other preprocedural examination: Secondary | ICD-10-CM | POA: Diagnosis present

## 2019-04-10 DIAGNOSIS — R9431 Abnormal electrocardiogram [ECG] [EKG]: Secondary | ICD-10-CM | POA: Diagnosis not present

## 2019-04-10 DIAGNOSIS — Z20828 Contact with and (suspected) exposure to other viral communicable diseases: Secondary | ICD-10-CM | POA: Insufficient documentation

## 2019-04-10 HISTORY — DX: Fibromyalgia: M79.7

## 2019-04-10 HISTORY — DX: Other complications of anesthesia, initial encounter: T88.59XA

## 2019-04-10 HISTORY — DX: Hypothyroidism, unspecified: E03.9

## 2019-04-10 HISTORY — DX: Other specified postprocedural states: Z98.890

## 2019-04-10 HISTORY — DX: Nausea with vomiting, unspecified: R11.2

## 2019-04-10 LAB — HEMOGLOBIN A1C
Hgb A1c MFr Bld: 7.7 % — ABNORMAL HIGH (ref 4.8–5.6)
Mean Plasma Glucose: 174.29 mg/dL

## 2019-04-10 LAB — BASIC METABOLIC PANEL
Anion gap: 11 (ref 5–15)
BUN: 9 mg/dL (ref 6–20)
CO2: 25 mmol/L (ref 22–32)
Calcium: 9.4 mg/dL (ref 8.9–10.3)
Chloride: 99 mmol/L (ref 98–111)
Creatinine, Ser: 0.78 mg/dL (ref 0.44–1.00)
GFR calc Af Amer: >60 mL/min (ref >60)
GFR calc non Af Amer: >60 mL/min (ref >60)
Glucose, Bld: 161 mg/dL — ABNORMAL HIGH (ref 70–99)
Potassium: 4.3 mmol/L (ref 3.5–5.1)
Sodium: 135 mmol/L (ref 135–145)

## 2019-04-10 LAB — CBC
HCT: 48 % — ABNORMAL HIGH (ref 36.0–46.0)
Hemoglobin: 15.2 g/dL — ABNORMAL HIGH (ref 12.0–15.0)
MCH: 29.2 pg (ref 26.0–34.0)
MCHC: 31.7 g/dL (ref 30.0–36.0)
MCV: 92.1 fL (ref 80.0–100.0)
Platelets: 328 10*3/uL (ref 150–400)
RBC: 5.21 MIL/uL — ABNORMAL HIGH (ref 3.87–5.11)
RDW: 12.3 % (ref 11.5–15.5)
WBC: 9.6 10*3/uL (ref 4.0–10.5)
nRBC: 0 % (ref 0.0–0.2)

## 2019-04-10 LAB — GLUCOSE, CAPILLARY: Glucose-Capillary: 156 mg/dL — ABNORMAL HIGH (ref 70–99)

## 2019-04-10 LAB — NO BLOOD PRODUCTS

## 2019-04-10 NOTE — H&P (Signed)
Sherri Cross is a 48 y.o.  female, G: 0 presenting  for  hysterectomy because of endometriosis, menorrhagia and dysmenorrhea.  For the past 5 years the patient has endured a 7 day menstrual flow with hourly pad changes and though given continuous oral contraceptives for management,  she continued to bleed the same. She admits to severe cramping rated 10/10 with no relief from Ibuprofen  but is unable to tolerate Vicodin. If her cramping is less severe, however, Ibuprofen will make  her pain more bearable.  She goes on to report that during bleeding episodes she has pelvic pain with voiding, defecation, tachycardia and will many times break out in a sweat. Otherwise she has no changes in her bowel or bladder function, vaginitis symptoms,  and only occasionally has non-positional dyspareunia.  An endometrial biopsy in June 2020 returned benign polyp with no malignancy.  A pelvic ultrasound at that same time revealed an anteverted uterus: (fundus to external os-6.1 cm): 3.94 x 3.23 x 4.50 cm, endometrium: 8.26 mm, right ovary- 1.87 cm and left ovary-1.90 cm.  A review of both medical and surgical management options were given to the patient however, she desires definitive therapy in the form of hysterectomy and removal of both ovaries.   Past Medical History  OB History: G:0  GYN History: menarche: 48 YO    LMP: 03/31/2019    Contraception: none;   Denies history of abnormal PAP smear.   Last PAP smear: 2020-normal   Medical History: Depression, Diabetes Mellitus, Alopecia, Right Breast Mass, Insomnia, Eczema, Endometriosis, Thyroid Disease, Fibromyalgia, Panic Attacks, Chronic Fatigue Syndrome and Anemia  Surgical History: 2010 Uterine Polypectomy Denies problems with anesthesia or history of blood transfusions  Family History: Hypertension and Diabetes Mellitus  Social History: Married and unemployed; Denies tobacco or alcohol use   Medications: Armour Thyroid 90 mg daily Flexeril 10 mg 1/2  tablet bid prn Januvia 100 mg daily Metformin 1000 mg bid Crestor 20 mg daily   Allergies  Allergen Reactions  . Blood-Group Specific Substance    Vicodin cause stomach inflammation (per patient)  Denies sensitivity to peanuts, shellfish, soy, latex or adhesives.   ROS: Admits to glasses and  eczema on hands  but;  denies headache, vision changes, nasal congestion, dysphagia, tinnitus, dizziness, hoarseness, cough,  chest pain, shortness of breath, nausea, vomiting, diarrhea,constipation,  urinary frequency, urgency  dysuria, hematuria, vaginitis symptoms,  swelling of joints,easy bruising,  myalgias, arthralgias, skin rashes, unexplained weight loss and except as is mentioned in the history of present illness, patient's review of systems is otherwise negative.   Physical Exam Bp: 140/80    P: 100 bpm (repeat 80 bpm)  O2Sat. 99 % (room air)   R: 16   Weight:  182 lbs.  Height: 5'1" BMI: 34.4  Neck: supple without masses or thyromegaly Lungs: clear to auscultation Heart: regular rate and rhythm Abdomen: soft, diffusely tender without guarding and no organomegaly Pelvic:EGBUS- wnl; vagina-normal rugae; uterus-normal size, cervix without lesions or motion tenderness; adnexae-mild bilateral  tenderness but no  masses Extremities:  no clubbing, cyanosis or edema   Assesment:  Menorrhagia                       Dysmenorrhea                       Endometriosis   Disposition:  A discussion was held with patient regarding the indication for her procedure(s) along with the  risks, which include but are not limited to: reaction to anesthesia, damage to adjacent organs, infection,  excessive bleeding, no resolution of pelvic pain and possible need for an open abdominal incision.  The patient verbalized understanding of these risks and has consented to proceed with a Laparoscopically Assisted Vaginal Hysterectomy, Bilateral Salpingo-oophorectomy with Possible Total Abdominal Hysterectomy at [facility] on April 14, 2019   CSN# [id number]   Dahlia Nifong J. Florene Glen, PA-C  for Dr. Franklyn Lor. Dillard

## 2019-04-10 NOTE — Progress Notes (Addendum)
PCP - Janett Billow [facility] Cardiologist -   Chest x-ray -  EKG - 04-10-19 epic Stress Test -  ECHO -  Cardiac Cath -   Sleep Study -  CPAP -   Fasting Blood Sugar -  Checks Blood Sugar _____ times a day  Blood Thinner Instructions: Aspirin Instructions: Last Dose:  Anesthesia review: N/A  Patient denies shortness of breath, fever, cough and chest pain at PAT appointment   Patient verbalized understanding of instructions that were given to them at the PAT appointment. Patient was also instructed that they will need to review over the PAT instructions again at home before surgery.

## 2019-04-11 ENCOUNTER — Other Ambulatory Visit: Payer: Self-pay | Admitting: Obstetrics and Gynecology

## 2019-04-11 LAB — NOVEL CORONAVIRUS, NAA (HOSP ORDER, SEND-OUT TO REF LAB; TAT 18-24 HRS): SARS-CoV-2, NAA: NOT DETECTED

## 2019-04-14 ENCOUNTER — Encounter (HOSPITAL_BASED_OUTPATIENT_CLINIC_OR_DEPARTMENT_OTHER): Payer: Self-pay

## 2019-04-14 ENCOUNTER — Observation Stay (HOSPITAL_BASED_OUTPATIENT_CLINIC_OR_DEPARTMENT_OTHER): Payer: 59 | Admitting: Anesthesiology

## 2019-04-14 ENCOUNTER — Other Ambulatory Visit: Payer: Self-pay

## 2019-04-14 ENCOUNTER — Ambulatory Visit (HOSPITAL_BASED_OUTPATIENT_CLINIC_OR_DEPARTMENT_OTHER)
Admission: RE | Admit: 2019-04-14 | Discharge: 2019-04-15 | Disposition: A | Discharge status: Home / Self Care | Payer: 59 | Attending: Obstetrics and Gynecology | Admitting: Obstetrics and Gynecology

## 2019-04-14 ENCOUNTER — Encounter (HOSPITAL_BASED_OUTPATIENT_CLINIC_OR_DEPARTMENT_OTHER)
Admission: RE | Disposition: A | Discharge status: Home / Self Care | Payer: Self-pay | Source: Home / Self Care | Attending: Obstetrics and Gynecology

## 2019-04-14 DIAGNOSIS — N800 Endometriosis of uterus: Secondary | ICD-10-CM | POA: Insufficient documentation

## 2019-04-14 DIAGNOSIS — Z885 Allergy status to narcotic agent status: Secondary | ICD-10-CM | POA: Insufficient documentation

## 2019-04-14 DIAGNOSIS — R102 Pelvic and perineal pain: Secondary | ICD-10-CM | POA: Insufficient documentation

## 2019-04-14 DIAGNOSIS — F909 Attention-deficit hyperactivity disorder, unspecified type: Secondary | ICD-10-CM | POA: Insufficient documentation

## 2019-04-14 DIAGNOSIS — N736 Female pelvic peritoneal adhesions (postinfective): Secondary | ICD-10-CM | POA: Insufficient documentation

## 2019-04-14 DIAGNOSIS — N920 Excessive and frequent menstruation with regular cycle: Secondary | ICD-10-CM | POA: Diagnosis present

## 2019-04-14 DIAGNOSIS — D251 Intramural leiomyoma of uterus: Secondary | ICD-10-CM | POA: Insufficient documentation

## 2019-04-14 DIAGNOSIS — Z7984 Long term (current) use of oral hypoglycemic drugs: Secondary | ICD-10-CM | POA: Diagnosis not present

## 2019-04-14 DIAGNOSIS — N946 Dysmenorrhea, unspecified: Secondary | ICD-10-CM | POA: Insufficient documentation

## 2019-04-14 DIAGNOSIS — Z79899 Other long term (current) drug therapy: Secondary | ICD-10-CM | POA: Insufficient documentation

## 2019-04-14 DIAGNOSIS — Z79890 Hormone replacement therapy (postmenopausal): Secondary | ICD-10-CM | POA: Insufficient documentation

## 2019-04-14 DIAGNOSIS — G8929 Other chronic pain: Secondary | ICD-10-CM | POA: Diagnosis not present

## 2019-04-14 DIAGNOSIS — R5382 Chronic fatigue, unspecified: Secondary | ICD-10-CM | POA: Insufficient documentation

## 2019-04-14 DIAGNOSIS — E119 Type 2 diabetes mellitus without complications: Secondary | ICD-10-CM | POA: Diagnosis not present

## 2019-04-14 DIAGNOSIS — E039 Hypothyroidism, unspecified: Secondary | ICD-10-CM | POA: Diagnosis not present

## 2019-04-14 HISTORY — PX: LAPAROSCOPIC VAGINAL HYSTERECTOMY WITH SALPINGECTOMY: SHX6680

## 2019-04-14 HISTORY — DX: Other specified health status: Z78.9

## 2019-04-14 LAB — GLUCOSE, CAPILLARY
Glucose-Capillary: 137 mg/dL — ABNORMAL HIGH (ref 70–99)
Glucose-Capillary: 246 mg/dL — ABNORMAL HIGH (ref 70–99)
Glucose-Capillary: 226 mg/dL — ABNORMAL HIGH (ref 70–99)
Glucose-Capillary: 283 mg/dL — ABNORMAL HIGH (ref 70–99)
Glucose-Capillary: 361 mg/dL — ABNORMAL HIGH (ref 70–99)
Glucose-Capillary: 310 mg/dL — ABNORMAL HIGH (ref 70–99)

## 2019-04-14 LAB — POCT PREGNANCY, URINE: Preg Test, Ur: NEGATIVE

## 2019-04-14 SURGERY — HYSTERECTOMY, VAGINAL, LAPAROSCOPY-ASSISTED, WITH SALPINGECTOMY
Anesthesia: General | Site: Abdomen | Laterality: Bilateral

## 2019-04-14 MED ORDER — FENTANYL CITRATE (PF) 100 MCG/2ML IJ SOLN
INTRAMUSCULAR | Status: DC | PRN
  Administered 2019-04-14: 100 ug via INTRAVENOUS
  Administered 2019-04-14 (×5): 50 ug via INTRAVENOUS

## 2019-04-14 MED ORDER — DIPHENHYDRAMINE HCL 12.5 MG/5ML PO ELIX
12.5000 mg | ORAL_SOLUTION | Freq: Four times a day (QID) | ORAL | Status: DC | PRN
  Filled 2019-04-14: qty 5

## 2019-04-14 MED ORDER — OXYCODONE HCL 5 MG/5ML PO SOLN
5.0000 mg | Freq: Once | ORAL | Status: DC | PRN
  Filled 2019-04-14: qty 5

## 2019-04-14 MED ORDER — SIMETHICONE 80 MG PO CHEW
80.0000 mg | CHEWABLE_TABLET | Freq: Four times a day (QID) | ORAL | Status: DC | PRN
  Filled 2019-04-14: qty 1

## 2019-04-14 MED ORDER — PROPOFOL 10 MG/ML IV BOLUS
INTRAVENOUS | Status: AC
  Filled 2019-04-14: qty 20

## 2019-04-14 MED ORDER — FLUORESCEIN SODIUM 10 % IV SOLN
INTRAVENOUS | Status: AC
  Filled 2019-04-14: qty 5

## 2019-04-14 MED ORDER — INSULIN ASPART 100 UNIT/ML ~~LOC~~ SOLN
4.0000 [IU] | Freq: Once | SUBCUTANEOUS | Status: AC
  Administered 2019-04-14: 18:00:00 4 [IU] via SUBCUTANEOUS
  Filled 2019-04-14: qty 0.04

## 2019-04-14 MED ORDER — MENTHOL 3 MG MT LOZG
LOZENGE | OROMUCOSAL | Status: AC
  Filled 2019-04-14: qty 9

## 2019-04-14 MED ORDER — PROMETHAZINE HCL 25 MG/ML IJ SOLN
6.2500 mg | INTRAMUSCULAR | Status: DC | PRN
  Filled 2019-04-14: qty 1

## 2019-04-14 MED ORDER — LACTATED RINGERS IV SOLN
INTRAVENOUS | Status: DC
  Administered 2019-04-14 (×3): via INTRAVENOUS
  Filled 2019-04-14: qty 1000

## 2019-04-14 MED ORDER — ROCURONIUM BROMIDE 10 MG/ML (PF) SYRINGE
PREFILLED_SYRINGE | INTRAVENOUS | Status: AC
  Filled 2019-04-14: qty 10

## 2019-04-14 MED ORDER — DIPHENHYDRAMINE HCL 50 MG/ML IJ SOLN
12.5000 mg | Freq: Four times a day (QID) | INTRAMUSCULAR | Status: DC | PRN
  Filled 2019-04-14: qty 0.25

## 2019-04-14 MED ORDER — KETOROLAC TROMETHAMINE 30 MG/ML IJ SOLN
INTRAMUSCULAR | Status: AC
  Filled 2019-04-14: qty 1

## 2019-04-14 MED ORDER — DOCUSATE SODIUM 100 MG PO CAPS
100.0000 mg | ORAL_CAPSULE | Freq: Two times a day (BID) | ORAL | Status: DC
  Administered 2019-04-14: 100 mg via ORAL
  Filled 2019-04-14: qty 1

## 2019-04-14 MED ORDER — NALOXONE HCL 0.4 MG/ML IJ SOLN
0.4000 mg | INTRAMUSCULAR | Status: DC | PRN
  Filled 2019-04-14: qty 1

## 2019-04-14 MED ORDER — PROPOFOL 10 MG/ML IV BOLUS
INTRAVENOUS | Status: DC | PRN
  Administered 2019-04-14: 150 mg via INTRAVENOUS
  Administered 2019-04-14: 30 mg via INTRAVENOUS

## 2019-04-14 MED ORDER — SUGAMMADEX SODIUM 200 MG/2ML IV SOLN
INTRAVENOUS | Status: DC | PRN
  Administered 2019-04-14: 175 mg via INTRAVENOUS

## 2019-04-14 MED ORDER — METFORMIN HCL 500 MG PO TABS
1000.0000 mg | ORAL_TABLET | Freq: Two times a day (BID) | ORAL | Status: DC
  Administered 2019-04-14 – 2019-04-15 (×2): 1000 mg via ORAL
  Filled 2019-04-14: qty 2

## 2019-04-14 MED ORDER — ACETAMINOPHEN 500 MG PO TABS
1000.0000 mg | ORAL_TABLET | Freq: Once | ORAL | Status: DC
  Filled 2019-04-14: qty 2

## 2019-04-14 MED ORDER — KETOROLAC TROMETHAMINE 30 MG/ML IJ SOLN
INTRAMUSCULAR | Status: DC | PRN
  Administered 2019-04-14: 30 mg via INTRAVENOUS

## 2019-04-14 MED ORDER — KETOROLAC TROMETHAMINE 30 MG/ML IJ SOLN
30.0000 mg | Freq: Four times a day (QID) | INTRAMUSCULAR | Status: DC
  Administered 2019-04-15: 30 mg via INTRAVENOUS
  Filled 2019-04-14: qty 1

## 2019-04-14 MED ORDER — SODIUM CHLORIDE 0.9% FLUSH
9.0000 mL | INTRAVENOUS | Status: DC | PRN
  Filled 2019-04-14: qty 10

## 2019-04-14 MED ORDER — ONDANSETRON HCL 4 MG/2ML IJ SOLN
4.0000 mg | Freq: Four times a day (QID) | INTRAMUSCULAR | Status: DC | PRN
  Filled 2019-04-14: qty 2

## 2019-04-14 MED ORDER — MIDAZOLAM HCL 2 MG/2ML IJ SOLN
INTRAMUSCULAR | Status: AC
  Filled 2019-04-14: qty 2

## 2019-04-14 MED ORDER — ONDANSETRON HCL 4 MG PO TABS
4.0000 mg | ORAL_TABLET | Freq: Four times a day (QID) | ORAL | Status: DC | PRN
  Filled 2019-04-14: qty 1

## 2019-04-14 MED ORDER — ROCURONIUM BROMIDE 50 MG/5ML IV SOSY
PREFILLED_SYRINGE | INTRAVENOUS | Status: DC | PRN
  Administered 2019-04-14: 20 mg via INTRAVENOUS
  Administered 2019-04-14: 50 mg via INTRAVENOUS
  Administered 2019-04-14: 10 mg via INTRAVENOUS

## 2019-04-14 MED ORDER — FENTANYL CITRATE (PF) 100 MCG/2ML IJ SOLN
25.0000 ug | INTRAMUSCULAR | Status: DC | PRN
  Administered 2019-04-14: 50 ug via INTRAVENOUS
  Administered 2019-04-14 (×2): 25 ug via INTRAVENOUS
  Filled 2019-04-14: qty 1

## 2019-04-14 MED ORDER — ONDANSETRON HCL 4 MG/2ML IJ SOLN
INTRAMUSCULAR | Status: AC
  Filled 2019-04-14: qty 2

## 2019-04-14 MED ORDER — LACTATED RINGERS IV SOLN
INTRAVENOUS | Status: DC
  Administered 2019-04-14: via INTRAVENOUS
  Filled 2019-04-14 (×2): qty 1000

## 2019-04-14 MED ORDER — HYDROMORPHONE 1 MG/ML IV SOLN
INTRAVENOUS | Status: DC
  Filled 2019-04-14: qty 30

## 2019-04-14 MED ORDER — SODIUM CHLORIDE 0.9 % IV SOLN
2.0000 g | INTRAVENOUS | Status: AC
  Administered 2019-04-14: 2 g via INTRAVENOUS
  Filled 2019-04-14: qty 2

## 2019-04-14 MED ORDER — LIDOCAINE 2% (20 MG/ML) 5 ML SYRINGE
INTRAMUSCULAR | Status: DC | PRN
  Administered 2019-04-14: 100 mg via INTRAVENOUS

## 2019-04-14 MED ORDER — IBUPROFEN 600 MG PO TABS
600.0000 mg | ORAL_TABLET | Freq: Four times a day (QID) | ORAL | Status: DC
  Filled 2019-04-14: qty 1

## 2019-04-14 MED ORDER — ROSUVASTATIN CALCIUM 20 MG PO TABS
20.0000 mg | ORAL_TABLET | Freq: Every day | ORAL | Status: DC
  Administered 2019-04-14: 20 mg via ORAL
  Filled 2019-04-14: qty 1

## 2019-04-14 MED ORDER — LINAGLIPTIN 5 MG PO TABS
5.0000 mg | ORAL_TABLET | Freq: Every day | ORAL | Status: DC
  Filled 2019-04-14: qty 1

## 2019-04-14 MED ORDER — DOCUSATE SODIUM 100 MG PO CAPS
ORAL_CAPSULE | ORAL | Status: AC
  Filled 2019-04-14: qty 1

## 2019-04-14 MED ORDER — FENTANYL CITRATE (PF) 100 MCG/2ML IJ SOLN
INTRAMUSCULAR | Status: AC
  Filled 2019-04-14: qty 2

## 2019-04-14 MED ORDER — FENTANYL CITRATE (PF) 250 MCG/5ML IJ SOLN
INTRAMUSCULAR | Status: AC
  Filled 2019-04-14: qty 5

## 2019-04-14 MED ORDER — INSULIN ASPART 100 UNIT/ML ~~LOC~~ SOLN
SUBCUTANEOUS | Status: AC
  Filled 2019-04-14: qty 1

## 2019-04-14 MED ORDER — DEXAMETHASONE SODIUM PHOSPHATE 10 MG/ML IJ SOLN
INTRAMUSCULAR | Status: AC
  Filled 2019-04-14: qty 1

## 2019-04-14 MED ORDER — OXYCODONE HCL 5 MG PO TABS
5.0000 mg | ORAL_TABLET | Freq: Once | ORAL | Status: DC | PRN
  Filled 2019-04-14: qty 1

## 2019-04-14 MED ORDER — MIDAZOLAM HCL 5 MG/5ML IJ SOLN
INTRAMUSCULAR | Status: DC | PRN
  Administered 2019-04-14: 2 mg via INTRAVENOUS

## 2019-04-14 MED ORDER — DEXAMETHASONE SODIUM PHOSPHATE 10 MG/ML IJ SOLN
INTRAMUSCULAR | Status: DC | PRN
  Administered 2019-04-14: 10 mg via INTRAVENOUS

## 2019-04-14 MED ORDER — VASOPRESSIN 20 UNIT/ML IV SOLN
INTRAVENOUS | Status: DC | PRN
  Administered 2019-04-14: 15:00:00 16 mL via INTRAMUSCULAR

## 2019-04-14 MED ORDER — THYROID 60 MG PO TABS
90.0000 mg | ORAL_TABLET | Freq: Every day | ORAL | Status: DC
  Filled 2019-04-14: qty 1

## 2019-04-14 MED ORDER — MENTHOL 3 MG MT LOZG
1.0000 | LOZENGE | OROMUCOSAL | Status: DC | PRN
  Administered 2019-04-14: 19:00:00 3 mg via ORAL
  Filled 2019-04-14: qty 9

## 2019-04-14 MED ORDER — OXYCODONE-ACETAMINOPHEN 5-325 MG PO TABS
ORAL_TABLET | ORAL | Status: AC
  Filled 2019-04-14: qty 2

## 2019-04-14 MED ORDER — ONDANSETRON HCL 4 MG/2ML IJ SOLN
INTRAMUSCULAR | Status: DC | PRN
  Administered 2019-04-14: 4 mg via INTRAVENOUS

## 2019-04-14 MED ORDER — LIDOCAINE 2% (20 MG/ML) 5 ML SYRINGE
INTRAMUSCULAR | Status: AC
  Filled 2019-04-14: qty 5

## 2019-04-14 MED ORDER — SODIUM CHLORIDE 0.9 % IV SOLN
INTRAVENOUS | Status: AC
  Filled 2019-04-14: qty 2

## 2019-04-14 MED ORDER — FLUORESCEIN SODIUM 10 % IV SOLN
INTRAVENOUS | Status: DC | PRN
  Administered 2019-04-14: .5 mL via INTRAVENOUS

## 2019-04-14 MED ORDER — SODIUM CHLORIDE 0.9 % IR SOLN
Status: DC | PRN
  Administered 2019-04-14: 1000 mL via INTRAVESICAL
  Administered 2019-04-14: 3000 mL

## 2019-04-14 MED ORDER — KETOROLAC TROMETHAMINE 30 MG/ML IJ SOLN
30.0000 mg | Freq: Once | INTRAMUSCULAR | Status: AC | PRN
  Administered 2019-04-14: 30 mg via INTRAVENOUS
  Filled 2019-04-14: qty 1

## 2019-04-14 MED ORDER — BUPIVACAINE HCL (PF) 0.25 % IJ SOLN
INTRAMUSCULAR | Status: DC | PRN
  Administered 2019-04-14: 7 mL

## 2019-04-14 MED ORDER — INSULIN ASPART 100 UNIT/ML ~~LOC~~ SOLN
0.0000 [IU] | Freq: Three times a day (TID) | SUBCUTANEOUS | Status: DC
  Administered 2019-04-14: 9 [IU] via SUBCUTANEOUS
  Administered 2019-04-15: 2 [IU] via SUBCUTANEOUS
  Filled 2019-04-14: qty 0.09

## 2019-04-14 MED ORDER — OXYCODONE-ACETAMINOPHEN 5-325 MG PO TABS
1.0000 | ORAL_TABLET | Freq: Four times a day (QID) | ORAL | Status: DC | PRN
  Administered 2019-04-14 – 2019-04-15 (×3): 2 via ORAL
  Filled 2019-04-14: qty 2

## 2019-04-14 MED ORDER — LAMOTRIGINE 150 MG PO TABS
150.0000 mg | ORAL_TABLET | Freq: Every day | ORAL | Status: DC
  Administered 2019-04-15: 150 mg via ORAL
  Filled 2019-04-14: qty 1

## 2019-04-14 SURGICAL SUPPLY — 68 items
ADH SKN CLS APL DERMABOND .7 (GAUZE/BANDAGES/DRESSINGS) ×2
APL SRG 38 LTWT LNG FL B (MISCELLANEOUS) ×2
APPLICATOR ARISTA FLEXITIP XL (MISCELLANEOUS) ×4 IMPLANT
BAG SPEC RTRVL LRG 6X4 10 (ENDOMECHANICALS) ×2
CANISTER SUCT 3000ML PPV (MISCELLANEOUS) ×4 IMPLANT
COVER BACK TABLE 60X90IN (DRAPES) ×4 IMPLANT
COVER MAYO STAND STRL (DRAPES) ×4 IMPLANT
COVER SURGICAL LIGHT HANDLE (MISCELLANEOUS) ×3 IMPLANT
DECANTER SPIKE VIAL GLASS SM (MISCELLANEOUS) ×6 IMPLANT
DERMABOND ADVANCED (GAUZE/BANDAGES/DRESSINGS) ×2
DERMABOND ADVANCED .7 DNX12 (GAUZE/BANDAGES/DRESSINGS) ×1 IMPLANT
DRAPE SHEET LG 3/4 BI-LAMINATE (DRAPES) ×8 IMPLANT
DRSG OPSITE POSTOP 3X4 (GAUZE/BANDAGES/DRESSINGS) ×4 IMPLANT
DURAPREP 26ML APPLICATOR (WOUND CARE) ×4 IMPLANT
ELECT REM PT RETURN 9FT ADLT (ELECTROSURGICAL) ×4
ELECTRODE REM PT RTRN 9FT ADLT (ELECTROSURGICAL) ×1 IMPLANT
FILTER SMOKE EVAC LAPAROSHD (FILTER) ×4 IMPLANT
FORCEPS CUTTING 33CM 5MM (CUTTING FORCEPS) ×3 IMPLANT
GAUZE 4X4 16PLY RFD (DISPOSABLE) ×3 IMPLANT
GAUZE PACKING 2X5 YD STRL (GAUZE/BANDAGES/DRESSINGS) ×3 IMPLANT
GLOVE BIO SURGEON STRL SZ 6.5 (GLOVE) ×6 IMPLANT
GLOVE BIO SURGEONS STRL SZ 6.5 (GLOVE) ×2
GLOVE BIOGEL PI IND STRL 6.5 (GLOVE) ×2 IMPLANT
GLOVE BIOGEL PI IND STRL 7.0 (GLOVE) ×8 IMPLANT
GLOVE BIOGEL PI INDICATOR 6.5 (GLOVE) ×2
GLOVE BIOGEL PI INDICATOR 7.0 (GLOVE) ×8
GOWN STRL REUS W/ TWL LRG LVL3 (GOWN DISPOSABLE) ×4 IMPLANT
GOWN STRL REUS W/TWL LRG LVL3 (GOWN DISPOSABLE) ×8
HEMOSTAT ARISTA ABSORB 3G PWDR (HEMOSTASIS) ×4 IMPLANT
HIBICLENS CHG 4% 4OZ BTL (MISCELLANEOUS) ×4 IMPLANT
KIT TURNOVER CYSTO (KITS) ×4 IMPLANT
LEGGING LITHOTOMY PAIR STRL (DRAPES) ×4 IMPLANT
NDL MAYO 6 CRC TAPER PT (NEEDLE) IMPLANT
NEEDLE MAYO 6 CRC TAPER PT (NEEDLE) ×4 IMPLANT
NS IRRIG 1000ML POUR BTL (IV SOLUTION) ×4 IMPLANT
PACK LAVH (CUSTOM PROCEDURE TRAY) ×4 IMPLANT
PACK ROBOTIC GOWN (GOWN DISPOSABLE) ×4 IMPLANT
PACK TRENDGUARD 450 HYBRID PRO (MISCELLANEOUS) ×1 IMPLANT
PAD ARMBOARD 7.5X6 YLW CONV (MISCELLANEOUS) ×4 IMPLANT
PAD OB MATERNITY 4.3X12.25 (PERSONAL CARE ITEMS) ×4 IMPLANT
POUCH LAPAROSCOPIC INSTRUMENT (MISCELLANEOUS) ×3 IMPLANT
POUCH SPECIMEN RETRIEVAL 10MM (ENDOMECHANICALS) ×3 IMPLANT
PROTECTOR NERVE ULNAR (MISCELLANEOUS) ×5 IMPLANT
SET CYSTO W/LG BORE CLAMP LF (SET/KITS/TRAYS/PACK) ×4 IMPLANT
SET IRRIG TUBING LAPAROSCOPIC (IRRIGATION / IRRIGATOR) ×3 IMPLANT
SET TUBE SMOKE EVAC HIGH FLOW (TUBING) ×4 IMPLANT
SHEARS HARMONIC ACE PLUS 36CM (ENDOMECHANICALS) IMPLANT
SHEET LAVH (DRAPES) ×4 IMPLANT
SOLUTION ELECTROLUBE (MISCELLANEOUS) ×3 IMPLANT
SPECIMEN JAR MEDIUM (MISCELLANEOUS) ×4 IMPLANT
SUT CHROMIC 0 CT 1 (SUTURE) ×4 IMPLANT
SUT CHROMIC 2 0 UR 5 27 (SUTURE) ×3 IMPLANT
SUT MNCRL AB 3-0 PS2 27 (SUTURE) ×8 IMPLANT
SUT VIC AB 0 CT1 18XCR BRD8 (SUTURE) ×5 IMPLANT
SUT VIC AB 0 CT1 27 (SUTURE) ×16
SUT VIC AB 0 CT1 27XBRD ANBCTR (SUTURE) ×8 IMPLANT
SUT VIC AB 0 CT1 36 (SUTURE) ×4 IMPLANT
SUT VIC AB 0 CT1 8-18 (SUTURE) ×8
SUT VICRYL 0 ENDOLOOP (SUTURE) ×6 IMPLANT
SUT VICRYL 0 TIES 12 18 (SUTURE) ×4 IMPLANT
SUT VICRYL 0 UR6 27IN ABS (SUTURE) ×4 IMPLANT
SYR BULB IRRIGATION 50ML (SYRINGE) ×4 IMPLANT
SYR TB 1ML 25GX5/8 (SYRINGE) ×4 IMPLANT
TRAY FOLEY W/BAG SLVR 14FR (SET/KITS/TRAYS/PACK) ×4 IMPLANT
TRENDGUARD 450 HYBRID PRO PACK (MISCELLANEOUS) ×4
TROCAR BALLN 12MMX100 BLUNT (TROCAR) ×4 IMPLANT
TROCAR XCEL NON-BLD 5MMX100MML (ENDOMECHANICALS) ×8 IMPLANT
WARMER LAPAROSCOPE (MISCELLANEOUS) ×4 IMPLANT

## 2019-04-14 NOTE — Anesthesia Postprocedure Evaluation (Signed)
Anesthesia Post Note  Patient: Sherri Cross  Procedure(s) Performed: LAPAROSCOPIC ASSISTED VAGINAL HYSTERECTOMY WITH SALPINGO-OOPHORECTOMY (Bilateral Abdomen)     Patient location during evaluation: PACU Anesthesia Type: General Level of consciousness: awake and alert Pain management: pain level controlled Vital Signs Assessment: post-procedure vital signs reviewed and stable Respiratory status: spontaneous breathing, nonlabored ventilation and respiratory function stable Cardiovascular status: blood pressure returned to baseline and stable Postop Assessment: no apparent nausea or vomiting Anesthetic complications: no    Last Vitals:  Vitals:   04/14/19 1808 04/14/19 1835  BP: (!) 153/92 (!) 157/84  Pulse: (!) 114 (!) 115  Resp: 17 18  Temp: 37.1 C 36.7 C  SpO2: 98% 99%    Last Pain:  Vitals:   04/14/19 1808  TempSrc:   PainSc: 2                  Corina Stacy,W. EDMOND

## 2019-04-14 NOTE — Op Note (Signed)
agnosis: menorrhagia.  Endometriosis  Postop Diagnosis:same with several pelvic adhesions  Procedure: LAVH, BSO LOA, Cystoscopy  Anesthesia: General   Anesthesiologist:   Attending: Betsy Coder, MD   Assistant: Earnstine Regal PA  Findings: normal size uterus. Bowel adhesions to the uterus and left adnexa.   Left adnexal side wall and uterine adhesions.  Right adnexal adhesions  Pathology: uterus and cervix and bilateral tubes and ovaries  Fluids: 2000 cccrystalloid  UOP: 1200cc  EBL: [id number]  Complications:none  Procedure: The patient was taken to the operating room, placed under general anesthesia and prepped and draped in the normal sterile fashion. A Foley catheter was placed in the bladder. A weighted speculum and vaginal retractors were placed in the vagina. Tenaculum was placed on the anterior lip of the cervix.  A hulka manipulator was placed in the uterus. Attention was then turned to the abdomen. A 10 mm infraumbilical incision was made with the scalpel after 5 cc of 25% percent Marcaine was used for local anesthesia. The subcutaneous tissue was dissected and the fascia was incised with the knife. A purse string stitch was placed in the fascia and Hassan placed into the intra-abdominal cavity and anchored to the suture. Intraabdominal placement was confirmed with the laparoscope.  Two 5 mm trochars were placed in the right and left lower quadrants under direct visualization with the laparoscope.I had to sharply lyse adhesions for about one hour.  There were adhesions from the sigmoidcolon to the adnexa on the left.  Adhesions from the left side wall to the adnexa.  Adhesions from the right adnexa to the right side wall.     .   Both round ligaments were cauterized and cut with the gyrus bipolar cautery as well and the bladder flap created with the gyrus and hydrodisection using the nigat and removed away from the uterus. The bladder adhesions were also lysed sharply.   The left  fallopian tube was cauterized and removed.    The left uterine ovarian ligament was then cauterized and cut.  The right fallopian tube was cauterized cut and removed.  The right utero-ovarian ligament was cauterized and cut with the tripolar cautery gyrus.  LOA took approximately one hour.  The utero ovarian ligaments were further disected out.  endoloops were placed on the infindibulo pelvic ligaments and the the ovaries were then removed with the gyrus.    Attention was then turned to the vagina.  A weighted speculum was placed in the posterior fourchette.  petrussin mixture was placed circumferentially around the cervix.  With blunt and sharp dissection the cervix was dissected away from the bowel and bladder.  Both uterosacral ligaments were clamped, cut and suture ligated and held.  The anterior and posterior culdesac was entered sharply using metzenbaum scissors.  The cardinal ligaments and  The uterine arteries were clamped, cut and suture ligated bilaterally.    The uterus was then delivered. The left ovary was also delivered.    The mcall suture was placed.   The vaginal cuff was closed with interrupted suture of 0 chromic.   the patient was given flurosein.  Cystoscopy was performed and both ureters were seen to efflux fluroscein without difficulty. The bladder had full integrity with no suture or laceration visualized.  The vagina was inspected and the cuff was noted to be intact.  Attention was then turned back to the abdomen after removing top pair of gloves. The abdomen was reinsufflated with CO2 gas.   The abdomen and pelvis  was copiously irrigated.       hemostasis was noted.  arista was placed along the cuff.  The ovary was located and placed in an endocatch and removed.     All trochars were removed under direct visualization using the laparoscope.  The umbilical fascia was reapproximated by tying the circumferential suture. The two 5 mm incisions were closed with 3-0 Monocryl via a subcuticular  stitch.  All remaining skin incisions were closed with Dermabond and the 10 mm skin incisions were reinforced using Dermabond.  Sponge lap and needle counts were correct.  The patient tolerated the procedure well and was returned to the PACU in stable condition

## 2019-04-14 NOTE — Transfer of Care (Signed)
Immediate Anesthesia Transfer of Care Note  Patient: Sherri Cross  Procedure(s) Performed: LAPAROSCOPIC ASSISTED VAGINAL HYSTERECTOMY WITH SALPINGO-OOPHORECTOMY (Bilateral Abdomen) POSSIBLE HYSTERECTOMY ABDOMINAL WITH BILATERAL SALPINGO-OOPHORECTOMY (Bilateral )  Patient Location: PACU  Anesthesia Type:General  Level of Consciousness: drowsy  Airway & Oxygen Therapy: Patient Spontanous Breathing and Patient connected to nasal cannula oxygen  Post-op Assessment: Report given to RN  Post vital signs: Reviewed and stable  Last Vitals: 131/78, 114, 16, 100%, 98.4 Vitals Value Taken Time  BP    Temp    Pulse    Resp    SpO2      Last Pain:  Vitals:   04/14/19 1132  TempSrc: Oral  PainSc: 0-No pain      Patients Stated Pain Goal: 7 ([id number])  Complications: No apparent anesthesia complications

## 2019-04-14 NOTE — Anesthesia Procedure Notes (Signed)
Procedure Name: Intubation Date/Time: 04/14/2019 1:01 PM Performed by: Bonney Aid, CRNA Pre-anesthesia Checklist: Patient identified, Emergency Drugs available, Suction available and Patient being monitored Patient Re-evaluated:Patient Re-evaluated prior to induction Oxygen Delivery Method: Circle system utilized Preoxygenation: Pre-oxygenation with 100% oxygen Induction Type: IV induction Ventilation: Mask ventilation without difficulty Laryngoscope Size: Mac and 3 Grade View: Grade I Tube type: Oral Tube size: 7.0 mm Number of attempts: 1 Airway Equipment and Method: Stylet Placement Confirmation: ETT inserted through vocal cords under direct vision,  positive ETCO2 and breath sounds checked- equal and bilateral Secured at: 21 cm Tube secured with: Tape Dental Injury: Teeth and Oropharynx as per pre-operative assessment

## 2019-04-14 NOTE — Anesthesia Preprocedure Evaluation (Addendum)
Anesthesia Evaluation  Patient identified by MRN, date of birth, ID band Patient awake    Reviewed: Allergy & Precautions, NPO status , Patient's Chart, lab work & pertinent test results  History of Anesthesia Complications (+) PONV and history of anesthetic complications  Airway Mallampati: II  TM Distance: >3 FB Neck ROM: Full    Dental  (+) Chipped,    Pulmonary neg pulmonary ROS,    Pulmonary exam normal        Cardiovascular negative cardio ROS Normal cardiovascular exam     Neuro/Psych Anxiety ADHDnegative neurological ROS     GI/Hepatic negative GI ROS, Neg liver ROS,   Endo/Other  diabetes, Type 2, Oral Hypoglycemic AgentsHypothyroidism   Renal/GU negative Renal ROS  negative genitourinary   Musculoskeletal  (+) Fibromyalgia -  Abdominal   Peds  Hematology  (+) REFUSES BLOOD PRODUCTS, JEHOVAH'S WITNESS  Anesthesia Other Findings Day of surgery medications reviewed with patient.  Reproductive/Obstetrics negative OB ROS                            Anesthesia Physical Anesthesia Plan  ASA: II  Anesthesia Plan: General   Post-op Pain Management:    Induction: Intravenous  PONV Risk Score and Plan: 4 or greater and Scopolamine patch - Pre-op, Treatment may vary due to age or medical condition, Ondansetron, Dexamethasone and Midazolam  Airway Management Planned: Oral ETT  Additional Equipment: None  Intra-op Plan:   Post-operative Plan: Extubation in OR  Informed Consent: I have reviewed the patients History and Physical, chart, labs and discussed the procedure including the risks, benefits and alternatives for the proposed anesthesia with the patient or authorized representative who has indicated his/her understanding and acceptance.     Dental advisory given  Plan Discussed with: CRNA  Anesthesia Plan Comments: ([place] witness, refuses all blood products even in  life/death circumstance. Will accept albumin. Daiva Huge, MD)       Anesthesia Quick Evaluation

## 2019-04-15 ENCOUNTER — Encounter (HOSPITAL_BASED_OUTPATIENT_CLINIC_OR_DEPARTMENT_OTHER): Payer: Self-pay | Admitting: Obstetrics and Gynecology

## 2019-04-15 DIAGNOSIS — N920 Excessive and frequent menstruation with regular cycle: Secondary | ICD-10-CM | POA: Diagnosis not present

## 2019-04-15 LAB — BASIC METABOLIC PANEL
Anion gap: 12 (ref 5–15)
BUN: 8 mg/dL (ref 6–20)
CO2: 22 mmol/L (ref 22–32)
Calcium: 9 mg/dL (ref 8.9–10.3)
Chloride: 100 mmol/L (ref 98–111)
Creatinine, Ser: 0.71 mg/dL (ref 0.44–1.00)
GFR calc Af Amer: >60 mL/min (ref >60)
GFR calc non Af Amer: >60 mL/min (ref >60)
Glucose, Bld: 265 mg/dL — ABNORMAL HIGH (ref 70–99)
Potassium: 3.8 mmol/L (ref 3.5–5.1)
Sodium: 134 mmol/L — ABNORMAL LOW (ref 135–145)

## 2019-04-15 LAB — CBC
HCT: 41.2 % (ref 36.0–46.0)
Hemoglobin: 13.5 g/dL (ref 12.0–15.0)
MCH: 29.6 pg (ref 26.0–34.0)
MCHC: 32.8 g/dL (ref 30.0–36.0)
MCV: 90.4 fL (ref 80.0–100.0)
Platelets: 301 10*3/uL (ref 150–400)
RBC: 4.56 MIL/uL (ref 3.87–5.11)
RDW: 11.8 % (ref 11.5–15.5)
WBC: 13.5 10*3/uL — ABNORMAL HIGH (ref 4.0–10.5)
nRBC: 0 % (ref 0.0–0.2)

## 2019-04-15 LAB — GLUCOSE, CAPILLARY: Glucose-Capillary: 188 mg/dL — ABNORMAL HIGH (ref 70–99)

## 2019-04-15 MED ORDER — OXYCODONE-ACETAMINOPHEN 5-325 MG PO TABS
ORAL_TABLET | ORAL | Status: AC
  Filled 2019-04-15: qty 2

## 2019-04-15 MED ORDER — KETOROLAC TROMETHAMINE 30 MG/ML IJ SOLN
INTRAMUSCULAR | Status: AC
  Filled 2019-04-15: qty 1

## 2019-04-15 MED ORDER — OXYCODONE-ACETAMINOPHEN 5-325 MG PO TABS: ORAL_TABLET | ORAL | 0 refills | Status: DC

## 2019-04-15 MED ORDER — IBUPROFEN 600 MG PO TABS: ORAL_TABLET | ORAL | 1 refills | Status: DC

## 2019-04-15 NOTE — Progress Notes (Signed)
Vaginal packing removed with sanguineus drainage noted minimal amt

## 2019-04-15 NOTE — Discharge Summary (Signed)
Physician Discharge Summary  Patient ID: Sherri Cross MRN: [id number] DOB/AGE: 1971/05/28 48 y.o.  Admit date: 04/14/2019 Discharge date: 04/15/2019   Discharge Diagnoses:  Active Problems:   Menorrhagia   Chronic pelvic pain in female Endometriosis Pelvic Adhesions  Operation: Laparoscopically Assisted Vaginal Hysterectomy, Bilateral Salpingo-oophorectomy and Cystoscopy   Discharged Condition: Good  Hospital Course: On the date of admission the patient underwent the aforementioned procedures and tolerated them well.  Post operative course was unremarkable with the patient resuming bowel and bladder function by post operative day #1 and was therefore deemed ready for discharge home.  Discharge hemoglobin was 13.5.  Disposition: Home to Self Care  Discharge Medications:  Allergies as of 04/15/2019      Reactions   Blood-group Specific Substance    Jehovah's witness. Refuses all blood products except albumin. Erasmo Downer, MD (04/14/19)      Medication List    TAKE these medications   amphetamine-dextroamphetamine 10 MG tablet Commonly known as: ADDERALL Take 1 tablet (10 mg total) by mouth 2 (two) times daily with a meal. Ok to fill 30 days after rx   Armour Thyroid 90 MG tablet Generic drug: thyroid TAKE 1 TABLET BY MOUTH ONCE DAILY BEFORE BREAKFAST What changed: See the new instructions.   cyclobenzaprine 10 MG tablet Commonly known as: FLEXERIL TAKE 1/2 TO 1 TABLET BY  MOUTH 2 TIMES DAILY AS  NEEDED FOR MUSCLE SPASM What changed: See the new instructions.   fluticasone 50 MCG/ACT nasal spray Commonly known as: FLONASE Place 2 sprays into both nostrils daily.   ibuprofen 600 MG tablet Commonly known as: ADVIL take 1 tablet po pc every 6 hours for 5 days then prn for post operative pain What changed:   medication strength  how much to take  how to take this  when to take this  reasons to take this  additional instructions   lamoTRIgine 150 MG  tablet Commonly known as: LAMICTAL Take 150 mg by mouth daily.   metFORMIN 1000 MG tablet Commonly known as: GLUCOPHAGE TAKE 1 TABLET BY MOUTH TWO  TIMES DAILY WITH MEALS What changed: See the new instructions.   oxyCODONE-acetaminophen 5-325 MG tablet Commonly known as: PERCOCET/ROXICET take 1 tablet every 6 hours as needed for breakthrough post operative pain   rosuvastatin 20 MG tablet Commonly known as: CRESTOR TAKE 1 TABLET BY MOUTH  DAILY   sitaGLIPtin 100 MG tablet Commonly known as: Januvia TAKE 1 TABLET BY MOUTH ONCE DAILY What changed:   how much to take  how to take this  when to take this   triamcinolone ointment 0.1 % Commonly known as: KENALOG Apply 1 application topically 2 (two) times daily.          Follow-up: Dr. Gwynneth Munson A. Dillard on May 23, 2019 at 10 a.m.     Signed: Earnstine Regal, PA-C 04/15/2019, 7:30 AM

## 2019-04-15 NOTE — Discharge Instructions (Signed)
Laparoscopically Assisted Vaginal Hysterectomy, Care After °This sheet gives you information about how to care for yourself after your procedure. Your health care provider may also give you more specific instructions. If you have problems or questions, contact your health care provider. °What can I expect after the procedure? °After the procedure, it is common to have: °· Soreness and numbness in your incision areas. °· Abdominal pain. You will be given pain medicine to control it. °· Vaginal bleeding and discharge. You will need to use a sanitary napkin after this procedure. °· Sore throat from the breathing tube that was inserted during surgery. °Follow these instructions at home: °Medicines °· Take over-the-counter and prescription medicines only as told by your health care provider. °· Do not take aspirin or ibuprofen. These medicines can cause bleeding. °· Do not drive or use heavy machinery while taking prescription pain medicine. °· Do not drive for 24 hours if you were given a medicine to help you relax (sedative) during the procedure. °Incision care ° °· Follow instructions from your health care provider about how to take care of your incisions. Make sure you: °? Wash your hands with soap and water before you change your bandage (dressing). If soap and water are not available, use hand sanitizer. °? Change your dressing as told by your health care provider. °? Leave stitches (sutures), skin glue, or adhesive strips in place. These skin closures may need to stay in place for 2 weeks or longer. If adhesive strip edges start to loosen and curl up, you may trim the loose edges. Do not remove adhesive strips completely unless your health care provider tells you to do that. °· Check your incision area every day for signs of infection. Check for: °? Redness, swelling, or pain. °? Fluid or blood. °? Warmth. °? Pus or a bad smell. °Activity °· Get regular exercise as told by your health care provider. You may be  told to take short walks every day and go farther each time. °· Return to your normal activities as told by your health care provider. Ask your health care provider what activities are safe for you. °· Do not douche, use tampons, or have sexual intercourse for at least 6 weeks, or until your health care provider gives you permission. °· Do not lift anything that is heavier than 10 lb (4.5 kg), or the limit that your health care provider tells you, until he or she says that it is safe. °General instructions °· Do not take baths, swim, or use a hot tub until your health care provider approves. Take showers instead of baths. °· Do not drive for 24 hours if you received a sedative. °· Do not drive or operate heavy machinery while taking prescription pain medicine. °· To prevent or treat constipation while you are taking prescription pain medicine, your health care provider may recommend that you: °? Drink enough fluid to keep your urine clear or pale yellow. °? Take over-the-counter or prescription medicines. °? Eat foods that are high in fiber, such as fresh fruits and vegetables, whole grains, and beans. °? Limit foods that are high in fat and processed sugars, such as fried and sweet foods. °· Keep all follow-up visits as told by your health care provider. This is important. °Contact a health care provider if: °· You have signs of infection, such as: °? Redness, swelling, or pain around your incision sites. °? Fluid or blood coming from an incision. °? An incision that feels warm to the   touch. ? Pus or a bad smell coming from an incision.  Your incision breaks open.  Your pain medicine is not helping.  You feel dizzy or light-headed.  You have pain or bleeding when you urinate.  You have persistent nausea and vomiting.  You have blood, pus, or a bad-smelling discharge from your vagina. Get help right away if:  You have a fever.  You have severe abdominal pain.  You have chest pain.  You have  shortness of breath.  You faint.  You have pain, swelling, or redness in your leg.  You have heavy bleeding from your vagina. Summary  After the procedure, it is common to have abdominal pain and vaginal bleeding.  You should not drive or lift heavy objects until your health care provider says that it is safe.  Contact your health care provider if you have any symptoms of infection, excessive vaginal bleeding, nausea, vomiting, or shortness of breath. This information is not intended to replace advice given to you by your health care provider. Make sure you discuss any questions you have with your health care provider. Document Released: 05/11/2011 Document Revised: 05/04/2017 Document Reviewed: 07/18/2016 Elsevier Patient Education  2020 [employer]. Call [facility] @ [phone] if:  You have a temperature greater than or equal to 100.4 degrees Farenheit orally You have pain that is not made better by the pain medication given and taken as directed You have excessive bleeding or problems urinating  Take Colace (Docusate Sodium/Stool Softener) 100 mg 2-3 times daily while taking narcotic pain medicine to avoid constipation or until bowel movements are regular. Take Ibuprofen 600 mg with food every 6 hours for 5 days then as needed for pain  You may drive after 2  weeks You may walk up steps  You may shower  You may resume a regular modified carbohydrate  diet  Keep incisions clean and dry Do not lift over 15 pounds for 6 weeks Avoid anything in vagina for 6 weeks (or until after your post-operative visit)

## 2019-04-15 NOTE — Progress Notes (Signed)
Sherri Cross is a73 y.o.  [id number]  Post Op Date # 1: LAVH/BSO/LOA/Cystoscopy  Subjective: Patient is Doing well postoperatively. Patient has Pain is controlled with current analgesics. Medications being used: prescription NSAID's including Ketorolac 30 mg IV and narcotic analgesics including Percocet 5/325 mg. Has ambulated in the halls without difficulty, tolerated a regular diet but has not voided yet.   Objective: Vital signs in last 24 hours: Temp:  [97.8 F (36.6 C)-99 F (37.2 C)] 97.9 F (36.6 C) (11/10 0542) Pulse Rate:  [107-120] 107 (11/10 0542) Resp:  [9-20] 20 (11/10 0542) BP: (125-157)/(69-99) 140/83 (11/10 0542) SpO2:  [96 %-100 %] 99 % (11/10 0542) Weight:  [82.1 kg] 82.1 kg (11/09 1132)  Intake/Output from previous day: 11/09 0701 - 11/10 0700 In: 3465 [P.O.:240; I.V.:3125] Out: 6000 [Urine:5800] Intake/Output this shift: No intake/output data recorded. Recent Labs  Lab 04/10/19 0902 04/15/19 0427  WBC 9.6 13.5*  HGB 15.2* 13.5  HCT 48.0* 41.2  PLT 328 301     Recent Labs  Lab 04/10/19 0902 04/15/19 0427  NA 135 134*  K 4.3 3.8  CL 99 100  CO2 25 22  BUN 9 8  CREATININE 0.78 0.71  CALCIUM 9.4 9.0  GLUCOSE 161* 265*    EXAM: General: alert, cooperative and no distress Resp: clear to auscultation bilaterally Cardio: regular rate and rhythm, S1, S2 normal, no murmur, click, rub or gallop GI: bowel sounds present; wound sites intact without evidence of infection. Extremities: Homans sign is negative, no sign of DVT and no calf tenderness.   Assessment: s/p Procedure(s): LAPAROSCOPIC ASSISTED VAGINAL HYSTERECTOMY WITH SALPINGO-OOPHORECTOMY: stable, progressing well and tolerating diet  Plan: Discharge home Awaiting the patient to void.  LOS: 0 days    Earnstine Regal, PA-C 04/15/2019 7:15 AM

## 2019-04-16 LAB — SURGICAL PATHOLOGY

## 2019-06-03 ENCOUNTER — Ambulatory Visit: Payer: Self-pay | Admitting: Family Medicine

## 2019-06-03 NOTE — Telephone Encounter (Signed)
Seeing you tomorrow for visit as pcp is out of office.

## 2019-06-03 NOTE — Telephone Encounter (Signed)
Pt states had surgery 7 weeks ago, on steroids. States BS 350 while n hospital, 150's at discharge. States 1 week ago had "Muscle fatigue about 5 minutes after eating." Denies any dizziness, no visual changes.  States increased weakness, last 3-4 hours after eating. Also states urine cloudy and mouth dry at times. Pt does not home monitor to check blood sugars. No missed doses of diabetic meds. States "I've been reading online and I think my BS runs too high."  Call transferred to practice, [facility],  for consideration of appt.  Reason for Disposition . [1] Symptoms of high blood sugar (e.g., frequent urination, weak, weight loss) AND [2] not able to test blood glucose  Answer Assessment - Initial Assessment Questions 1. BLOOD GLUCOSE: "What is your blood glucose level?"     See summary 2. ONSET: "When did you check the blood glucose?"     *No Answer* 3. USUAL RANGE: "What is your glucose level usually?" (e.g., usual fasting morning value, usual evening value)     *No Answer* 4. KETONES: "Do you check for ketones (urine or blood test strips)?" If yes, ask: "What does the test show now?"      *No Answer* 5. TYPE 1 or 2:  "Do you know what type of diabetes you have?"  (e.g., Type 1, Type 2, Gestational; doesn't know)      *No Answer* 6. INSULIN: "Do you take insulin?" "What type of insulin(s) do you use? What is the mode of delivery? (syringe, pen; injection or pump)?"      no 7. DIABETES PILLS: "Do you take any pills for your diabetes?" If yes, ask: "Have you missed taking any pills recently?"     Yes, no missed doses 8. OTHER SYMPTOMS: "Do you have any symptoms?" (e.g., fever, frequent urination, difficulty breathing, dizziness, weakness, vomiting)     Urine cloudy at times, dry mouth at times  Protocols used: DIABETES - HIGH BLOOD SUGAR-A-AH

## 2019-06-04 ENCOUNTER — Telehealth: Payer: Self-pay | Admitting: Family Medicine

## 2019-06-04 ENCOUNTER — Other Ambulatory Visit: Payer: Self-pay

## 2019-06-04 ENCOUNTER — Encounter: Payer: Self-pay | Admitting: Family Medicine

## 2019-06-04 ENCOUNTER — Ambulatory Visit (INDEPENDENT_AMBULATORY_CARE_PROVIDER_SITE_OTHER): Payer: 59 | Admitting: Family Medicine

## 2019-06-04 VITALS — HR 60 | Temp 96.8°F | Ht 62.0 in | Wt 176.0 lb

## 2019-06-04 DIAGNOSIS — E1165 Type 2 diabetes mellitus with hyperglycemia: Secondary | ICD-10-CM

## 2019-06-04 MED ORDER — DAPAGLIFLOZIN PROPANEDIOL 10 MG PO TABS: 10.0000 mg | ORAL_TABLET | Freq: Every day | ORAL | 3 refills | Status: DC

## 2019-06-04 NOTE — Progress Notes (Signed)
Chief Complaint  Patient presents with  . Fatigue  . Polydipsia    Subjective: Patient is a 48 y.o. female here for fatigue and increased thrist. Due to COVID-19 pandemic, we are interacting via web portal for an electronic face-to-face visit. I verified patient's ID using 2 identifiers. Patient agreed to proceed with visit via this method. Patient is at home, I am at office. Patient and I are present for visit.   +Fatigue over past 9 d. Having some cloudy urine. Has increased thirst. Not urinating more.  She recently had surgery and has lost some weight from that, unsure if weight loss is unintentional or just due to recent procedure.  She does have a history of diabetes.  She takes Metformin 1000 mg twice daily and Januvia 100 mg daily.  No adverse effects and she reports compliance.  Her last A1c was 7.7 around 1 month ago.  She does not have glucometer and does not check her sugars routinely.   ROS: Endo: No wt gain  Past Medical History:  Diagnosis Date  . Anemia    iron  . Chronic fatigue syndrome with fibromyalgia 08/10/2016  . Complication of anesthesia   . Diabetes mellitus type 2, uncontrolled, without complications    type 2  . Fibromyalgia   . Hypothyroidism   . Panic attacks 10/21/2015  . PONV (postoperative nausea and vomiting)   . Refusal of blood products   . Thyroid disease     Objective: Pulse 60   Temp (!) 96.8 F (36 C) (Temporal)   Ht 5\' 2"  (1.575 m)   Wt 176 lb (79.8 kg)   BMI 32.19 kg/m  No conversational dyspnea Age appropriate judgment and insight Nml affect and mood  Assessment and Plan: Uncontrolled type 2 diabetes mellitus with hyperglycemia ([place]) - Plan: dapagliflozin propanediol (FARXIGA) 10 MG TABS tablet  Will add SGLT-2 blocker. Cont Januvia and Metformin.  She plans on purchasing a home glucose meter.  She will check at home 2-3 times per week.  I will add Wilder Glade and have her follow-up with Dr. Edilia Bo in around 1 month.  Counseled on  diet and exercise. The patient voiced understanding and agreement to the plan.  [place], DO 06/04/19  4:46 PM

## 2019-06-04 NOTE — Telephone Encounter (Signed)
Medication:  dapagliflozin propanediol (FARXIGA) 10 MG TABS tablet Pt called in stating she would like medication sent over to [facility] for a 90 day refill. Pt would also like clarification as to how many times she should be testing her glucose, as well as if she should stop any other medications or continue to take them all and add this. Please advise.

## 2019-06-04 NOTE — Telephone Encounter (Signed)
Please advise on discontinuation of any medications.

## 2019-06-05 ENCOUNTER — Other Ambulatory Visit: Payer: Self-pay | Admitting: Family Medicine

## 2019-06-05 ENCOUNTER — Encounter: Payer: Self-pay | Admitting: Family Medicine

## 2019-06-05 DIAGNOSIS — E119 Type 2 diabetes mellitus without complications: Secondary | ICD-10-CM

## 2019-06-05 MED ORDER — DAPAGLIFLOZIN PROPANEDIOL 10 MG PO TABS: 10.0000 mg | ORAL_TABLET | Freq: Every day | ORAL | 3 refills | Status: DC

## 2019-07-11 ENCOUNTER — Other Ambulatory Visit: Payer: Self-pay

## 2019-07-13 NOTE — Patient Instructions (Addendum)
It was nice to see you again today, I will be in touch with your labs Let us plan to visit in 6 months assuming all looks okay [place] job with weight loss so far!  Amazing job!

## 2019-07-13 NOTE — Progress Notes (Addendum)
Sherri Cross at [facility] [place], [place], [place] [address] [phone]  Date:  07/14/2019   Name:  Sherri Cross   DOB:  [birth year]   MRN:  [id number]  PCP:  Darreld Mclean, MD    Chief Complaint: Fatigue (feels tired all the time) and Diabetes   History of Present Illness:  Sherri Cross is a 49 y.o. very pleasant female patient who presents with the following:  Patient with history of diabetes, chronic fatigue, hypothyroidism, insomnia/anxiety here today for follow-up visit Last seen by myself in May 2020 She underwent hysterectomy and bilateral salpingo-oophorectomy for benign disease (endometriosis, menorrhagia, dysmenorrhea) in November She had a good result and is glad to not be bleeding any longer  She has felt fatigued since her operation. She was in the hospital just overnight and seems to have recovered well  She does not snore generally, does not think that she has sleep apnea.  She notes that she has been sleeping well recently since her mental health care provider adjusted her medications.  Her husband does snore and uses CPAP  Sherri Cross seems in very good spirits today.  She is walking about 2 miles a day, has lost some weight.  She is pleased with her progress  Lab Results  Component Value Date   HGBA1C 7.7 (H) 04/10/2019   BMP, CBC on chart from November Foot exam- today  Urine microalbumin is due Can do A1c today if she likes, due for lipids and thyroid unless being managed by endocrinology  Farxiga 10 Metformin 1000 twice daily Januvia Crestor Flexeril at bedtime for her back Armour thyroid  lamictal 150 once a day   Lab Results  Component Value Date   TSH 0.42 02/27/2018    Patient Active Problem List   Diagnosis Date Noted  . Uncontrolled type 2 diabetes mellitus with hyperglycemia ([place]) 06/04/2019  . Menorrhagia 04/14/2019  . Chronic pelvic pain in female 04/14/2019  . Anxiety  hyperventilation 11/05/2016  . Psychophysiological insomnia 11/05/2016  . Chronic fatigue syndrome with fibromyalgia 08/10/2016  . Insomnia 10/21/2015  . Anxiety disorder, unspecified 10/21/2015  . Hypothyroidism due to acquired atrophy of thyroid 10/21/2015  . Panic attacks 10/21/2015    Past Medical History:  Diagnosis Date  . Anemia    iron  . Chronic fatigue syndrome with fibromyalgia 08/10/2016  . Complication of anesthesia   . Diabetes mellitus type 2, uncontrolled, without complications    type 2  . Fibromyalgia   . Hypothyroidism   . Panic attacks 10/21/2015  . PONV (postoperative nausea and vomiting)   . Refusal of blood products   . Thyroid disease     Past Surgical History:  Procedure Laterality Date  . Enometrial    . LAPAROSCOPIC VAGINAL HYSTERECTOMY WITH SALPINGECTOMY Bilateral 04/14/2019   Procedure: LAPAROSCOPIC ASSISTED VAGINAL HYSTERECTOMY WITH SALPINGO-OOPHORECTOMY;  Surgeon: Crawford Givens, MD;  Location: [facility];  Service: Gynecology;  Laterality: Bilateral;  . POLYPECTOMY     Uterus    Social History   Tobacco Use  . Smoking status: Never Smoker  . Smokeless tobacco: Never Used  Substance Use Topics  . Alcohol use: No  . Drug use: No    Family History  Problem Relation Age of Onset  . Hypertension Mother   . Diabetes Father     Allergies  Allergen Reactions  . Blood-Group Specific Substance     Jehovah's witness. Refuses all blood products  except albumin. Erasmo Downer, MD (04/14/19)    Medication list has been reviewed and updated.  Current Outpatient Medications on File Prior to Visit  Medication Sig Dispense Refill  . amphetamine-dextroamphetamine (ADDERALL) 10 MG tablet Take 1 tablet (10 mg total) by mouth 2 (two) times daily with a meal. Ok to fill 30 days after rx 60 tablet 0  . ARMOUR THYROID 90 MG tablet TAKE 1 TABLET BY MOUTH ONCE DAILY BEFORE BREAKFAST (Patient taking differently: Take 90 mg by mouth daily. TAKE 1  TABLET BY MOUTH ONCE DAILY BEFORE BREAKFAST) 90 tablet 3  . cyclobenzaprine (FLEXERIL) 10 MG tablet TAKE 1/2 TO 1 TABLET BY  MOUTH 2 TIMES DAILY AS  NEEDED FOR MUSCLE SPASM (Patient taking differently: Take 10 mg by mouth daily. ) 60 tablet 2  . dapagliflozin propanediol (FARXIGA) 10 MG TABS tablet Take 10 mg by mouth daily. 90 tablet 3  . fluticasone (FLONASE) 50 MCG/ACT nasal spray Place 2 sprays into both nostrils daily.    Marland Kitchen lamoTRIgine (LAMICTAL) 150 MG tablet Take 150 mg by mouth daily.    . metFORMIN (GLUCOPHAGE) 1000 MG tablet TAKE 1 TABLET BY MOUTH TWO  TIMES DAILY WITH MEALS (Patient taking differently: Take 1,000 mg by mouth 2 (two) times daily with a meal. ) 180 tablet 3  . rosuvastatin (CRESTOR) 20 MG tablet TAKE 1 TABLET BY MOUTH  DAILY (Patient taking differently: Take 20 mg by mouth daily. ) 90 tablet 3  . sitaGLIPtin (JANUVIA) 100 MG tablet Take 1 tablet (100 mg total) by mouth daily. TAKE 1 TABLET BY MOUTH ONCE DAILY 90 tablet 1  . triamcinolone ointment (KENALOG) 0.1 % Apply 1 application topically 2 (two) times daily. 30 g 0   No current facility-administered medications on file prior to visit.    Review of Systems:  As per HPI- otherwise negative.   Physical Examination: Vitals:   07/14/19 0924  BP: 112/88  Pulse: 92  Resp: 16  Temp: (!) 96.7 F (35.9 C)  SpO2: 100%   Vitals:   07/14/19 0924  Weight: 166 lb (75.3 kg)  Height: 5\' 2"  (1.575 m)   Body mass index is 30.36 kg/m. Ideal Body Weight: Weight in (lb) to have BMI = 25: 136.4  GEN: WDWN, NAD, Non-toxic, A & O x 3, obese, looks well  HEENT: Atraumatic, Normocephalic. Neck supple. No masses, No LAD. Ears and Nose: No external deformity. CV: RRR, No M/G/R. No JVD. No thrill. No extra heart sounds. PULM: CTA B, no wheezes, crackles, rhonchi. No retractions. No resp. distress. No accessory muscle use. ABD: S, NT, ND, +BS. No rebound. No HSM. EXTR: No c/c/e NEURO Normal gait.  PSYCH: Normally  interactive. Conversant. Not depressed or anxious appearing.  Calm demeanor.  Foot exam today- normal   Wt Readings from Last 3 Encounters:  07/14/19 166 lb (75.3 kg)  06/04/19 176 lb (79.8 kg)  04/14/19 181 lb (82.1 kg)   She has lost some weight intentionally 15- 17 lbs She hopes to lose about 30 more by the end of this year   Assessment and Plan: Controlled type 2 diabetes mellitus without complication, without long-term current use of insulin ([place]) - Plan: Comprehensive metabolic panel, Hemoglobin A1c, Microalbumin / creatinine urine ratio  Dyslipidemia - Plan: Lipid panel  Hypothyroidism due to acquired atrophy of thyroid - Plan: TSH  Fatigue, unspecified type - Plan: TSH, Vitamin D (25 hydroxy), Ferritin, B12  Recent weight loss  Following up today for recheck of diabetes.  Expect her A1c is likely improved due to weight loss.  We will be in touch with her pending results.  Celebrated her progress so far Also follow-up on lipids and TSH today She has noted fatigue, it does not sound she has sleep apnea.  We will check labs as above and get in touch with her Moderate medical decision making This visit occurred during the SARS-CoV-2 public health emergency.  Safety protocols were in place, including screening questions prior to the visit, additional usage of staff PPE, and extensive cleaning of exam room while observing appropriate contact time as indicated for disinfecting solutions.    Signed Lamar Blinks, MD  Received her labs, message to pt  Results for orders placed or performed in visit on 07/14/19  Comprehensive metabolic panel  Result Value Ref Range   Sodium 138 135 - 145 mEq/L   Potassium 4.0 3.5 - 5.1 mEq/L   Chloride 101 96 - 112 mEq/L   CO2 27 19 - 32 mEq/L   Glucose, Bld 146 (H) 70 - 99 mg/dL   BUN 6 6 - 23 mg/dL   Creatinine, Ser 0.69 0.40 - 1.20 mg/dL   Total Bilirubin 0.4 0.2 - 1.2 mg/dL   Alkaline Phosphatase 90 39 - 117 U/L   AST 20 0 - 37 U/L    ALT 23 0 - 35 U/L   Total Protein 7.0 6.0 - 8.3 g/dL   Albumin 4.1 3.5 - 5.2 g/dL   GFR 90.73 >60.00 mL/min   Calcium 9.8 8.4 - 10.5 mg/dL  Hemoglobin A1c  Result Value Ref Range   Hgb A1c MFr Bld 7.0 (H) 4.6 - 6.5 %  TSH  Result Value Ref Range   TSH 0.01 (L) 0.35 - 4.50 uIU/mL  Lipid panel  Result Value Ref Range   Cholesterol 145 0 - 200 mg/dL   Triglycerides 87.0 0.0 - 149.0 mg/dL   HDL 49.90 >39.00 mg/dL   VLDL 17.4 0.0 - 40.0 mg/dL   LDL Cholesterol 78 0 - 99 mg/dL   Total CHOL/HDL Ratio 3    NonHDL 94.95   Microalbumin / creatinine urine ratio  Result Value Ref Range   Microalb, Ur 4.1 (H) 0.0 - 1.9 mg/dL   Creatinine,U 71.0 mg/dL   Microalb Creat Ratio 5.7 0.0 - 30.0 mg/g  Vitamin D (25 hydroxy)  Result Value Ref Range   VITD 30.06 30.00 - 100.00 ng/mL  Ferritin  Result Value Ref Range   Ferritin 43.9 10.0 - 291.0 ng/mL  B12  Result Value Ref Range   Vitamin B-12 158 (L) 211 - 911 pg/mL

## 2019-07-14 ENCOUNTER — Other Ambulatory Visit: Payer: Self-pay

## 2019-07-14 ENCOUNTER — Encounter: Payer: Self-pay | Admitting: Family Medicine

## 2019-07-14 ENCOUNTER — Ambulatory Visit (INDEPENDENT_AMBULATORY_CARE_PROVIDER_SITE_OTHER): Payer: 59 | Admitting: Family Medicine

## 2019-07-14 VITALS — BP 112/88 | HR 92 | Temp 96.7°F | Resp 16 | Ht 62.0 in | Wt 166.0 lb

## 2019-07-14 DIAGNOSIS — E034 Atrophy of thyroid (acquired): Secondary | ICD-10-CM

## 2019-07-14 DIAGNOSIS — E785 Hyperlipidemia, unspecified: Secondary | ICD-10-CM | POA: Diagnosis not present

## 2019-07-14 DIAGNOSIS — R5383 Other fatigue: Secondary | ICD-10-CM

## 2019-07-14 DIAGNOSIS — E119 Type 2 diabetes mellitus without complications: Secondary | ICD-10-CM

## 2019-07-14 DIAGNOSIS — R634 Abnormal weight loss: Secondary | ICD-10-CM

## 2019-07-14 LAB — MICROALBUMIN / CREATININE URINE RATIO
Creatinine,U: 71 mg/dL
Microalb Creat Ratio: 5.7 mg/g (ref 0.0–30.0)
Microalb, Ur: 4.1 mg/dL — ABNORMAL HIGH (ref 0.0–1.9)

## 2019-07-14 LAB — COMPREHENSIVE METABOLIC PANEL WITH GFR
ALT: 23 U/L (ref 0–35)
AST: 20 U/L (ref 0–37)
Albumin: 4.1 g/dL (ref 3.5–5.2)
Alkaline Phosphatase: 90 U/L (ref 39–117)
BUN: 6 mg/dL (ref 6–23)
CO2: 27 meq/L (ref 19–32)
Calcium: 9.8 mg/dL (ref 8.4–10.5)
Chloride: 101 meq/L (ref 96–112)
Creatinine, Ser: 0.69 mg/dL (ref 0.40–1.20)
GFR: 90.73 mL/min
Glucose, Bld: 146 mg/dL — ABNORMAL HIGH (ref 70–99)
Potassium: 4 meq/L (ref 3.5–5.1)
Sodium: 138 meq/L (ref 135–145)
Total Bilirubin: 0.4 mg/dL (ref 0.2–1.2)
Total Protein: 7 g/dL (ref 6.0–8.3)

## 2019-07-14 LAB — LIPID PANEL
Cholesterol: 145 mg/dL (ref 0–200)
HDL: 49.9 mg/dL (ref >39.00)
LDL Cholesterol: 78 mg/dL (ref 0–99)
NonHDL: 94.95
Total CHOL/HDL Ratio: 3
Triglycerides: 87 mg/dL (ref 0.0–149.0)
VLDL: 17.4 mg/dL (ref 0.0–40.0)

## 2019-07-14 LAB — VITAMIN B12: Vitamin B-12: 158 pg/mL — ABNORMAL LOW (ref 211–911)

## 2019-07-14 LAB — HEMOGLOBIN A1C: Hgb A1c MFr Bld: 7 % — ABNORMAL HIGH (ref 4.6–6.5)

## 2019-07-14 LAB — FERRITIN: Ferritin: 43.9 ng/mL (ref 10.0–291.0)

## 2019-07-14 LAB — TSH: TSH: 0.01 u[IU]/mL — ABNORMAL LOW (ref 0.35–4.50)

## 2019-07-14 LAB — VITAMIN D 25 HYDROXY (VIT D DEFICIENCY, FRACTURES): VITD: 30.06 ng/mL (ref 30.00–100.00)

## 2019-07-14 NOTE — Addendum Note (Signed)
Addended by: Lamar Blinks C on: 07/14/2019 01:56 PM   Modules accepted: Orders

## 2019-07-21 MED ORDER — THYROID 30 MG PO TABS: 75.0000 mg | ORAL_TABLET | Freq: Every day | ORAL | 3 refills | Status: DC

## 2019-07-22 MED ORDER — THYROID 30 MG PO TABS: 75.0000 mg | ORAL_TABLET | Freq: Every day | ORAL | 3 refills | Status: DC

## 2019-07-22 NOTE — Addendum Note (Signed)
Addended by: Lamar Blinks C on: 07/22/2019 02:07 PM   Modules accepted: Orders

## 2019-07-23 ENCOUNTER — Other Ambulatory Visit: Payer: Self-pay | Admitting: Family Medicine

## 2019-07-23 DIAGNOSIS — R252 Cramp and spasm: Secondary | ICD-10-CM

## 2019-07-23 DIAGNOSIS — S43402A Unspecified sprain of left shoulder joint, initial encounter: Secondary | ICD-10-CM

## 2019-07-23 MED ORDER — THYROID 60 MG PO TABS: 60.0000 mg | ORAL_TABLET | Freq: Every day | ORAL | 3 refills | Status: DC

## 2019-07-23 MED ORDER — THYROID 15 MG PO TABS: 15.0000 mg | ORAL_TABLET | Freq: Every day | ORAL | 3 refills | Status: DC

## 2019-07-23 NOTE — Addendum Note (Signed)
Addended by: Lamar Blinks C on: 07/23/2019 05:06 PM   Modules accepted: Orders

## 2019-08-20 ENCOUNTER — Other Ambulatory Visit: Payer: Self-pay

## 2019-08-21 ENCOUNTER — Ambulatory Visit: Payer: 59 | Admitting: Family Medicine

## 2019-08-21 NOTE — Progress Notes (Deleted)
[place] at [facility] [address], [place], [place] [address] [phone]  Date:  08/21/2019   Name:  Sherri Cross   DOB:  05-20-1971   MRN:  [id number]  PCP:  Darreld Mclean, MD    Chief Complaint: No chief complaint on file.   History of Present Illness:  Sherri Cross is a 49 y.o. very pleasant female patient who presents with the following:  Patient with history diabetes hypothyroidism, anxiety and depression.  Here today with concern for possible UTI Last seen by myself in February.  At that time she was in good spirits, had lost weight and was walking about 2 miles a day  At last visit her A1c looks good, TSH was suppressed.  We decreased her Armour Thyroid from 90 to 75-need to recheck TSH today  Patient Active Problem List   Diagnosis Date Noted  . Uncontrolled type 2 diabetes mellitus with hyperglycemia ([place]) 06/04/2019  . Menorrhagia 04/14/2019  . Chronic pelvic pain in female 04/14/2019  . Anxiety hyperventilation 11/05/2016  . Psychophysiological insomnia 11/05/2016  . Chronic fatigue syndrome with fibromyalgia 08/10/2016  . Insomnia 10/21/2015  . Anxiety disorder, unspecified 10/21/2015  . Hypothyroidism due to acquired atrophy of thyroid 10/21/2015  . Panic attacks 10/21/2015    Past Medical History:  Diagnosis Date  . Anemia    iron  . Chronic fatigue syndrome with fibromyalgia 08/10/2016  . Complication of anesthesia   . Diabetes mellitus type 2, uncontrolled, without complications    type 2  . Fibromyalgia   . Hypothyroidism   . Panic attacks 10/21/2015  . PONV (postoperative nausea and vomiting)   . Refusal of blood products   . Thyroid disease     Past Surgical History:  Procedure Laterality Date  . Enometrial    . LAPAROSCOPIC VAGINAL HYSTERECTOMY WITH SALPINGECTOMY Bilateral 04/14/2019   Procedure: LAPAROSCOPIC ASSISTED VAGINAL HYSTERECTOMY WITH SALPINGO-OOPHORECTOMY;  Surgeon:  Crawford Givens, MD;  Location: [facility];  Service: Gynecology;  Laterality: Bilateral;  . POLYPECTOMY     Uterus    Social History   Tobacco Use  . Smoking status: Never Smoker  . Smokeless tobacco: Never Used  Substance Use Topics  . Alcohol use: No  . Drug use: No    Family History  Problem Relation Age of Onset  . Hypertension Mother   . Diabetes Father     Allergies  Allergen Reactions  . Blood-Group Specific Substance     Jehovah's witness. Refuses all blood products except albumin. Erasmo Downer, MD (04/14/19)    Medication list has been reviewed and updated.  Current Outpatient Medications on File Prior to Visit  Medication Sig Dispense Refill  . amphetamine-dextroamphetamine (ADDERALL) 10 MG tablet Take 1 tablet (10 mg total) by mouth 2 (two) times daily with a meal. Ok to fill 30 days after rx 60 tablet 0  . cyclobenzaprine (FLEXERIL) 10 MG tablet TAKE 1/2 TO 1 TABLET BY  MOUTH 2 TIMES DAILY AS  NEEDED FOR MUSCLE SPASM 60 tablet 2  . dapagliflozin propanediol (FARXIGA) 10 MG TABS tablet Take 10 mg by mouth daily. 90 tablet 3  . fluticasone (FLONASE) 50 MCG/ACT nasal spray Place 2 sprays into both nostrils daily.    Marland Kitchen lamoTRIgine (LAMICTAL) 150 MG tablet Take 150 mg by mouth daily.    . metFORMIN (GLUCOPHAGE) 1000 MG tablet TAKE 1 TABLET BY MOUTH TWO  TIMES DAILY WITH MEALS (Patient taking  differently: Take 1,000 mg by mouth 2 (two) times daily with a meal. ) 180 tablet 3  . rosuvastatin (CRESTOR) 20 MG tablet TAKE 1 TABLET BY MOUTH  DAILY (Patient taking differently: Take 20 mg by mouth daily. ) 90 tablet 3  . sitaGLIPtin (JANUVIA) 100 MG tablet Take 1 tablet (100 mg total) by mouth daily. TAKE 1 TABLET BY MOUTH ONCE DAILY 90 tablet 1  . thyroid (ARMOUR THYROID) 15 MG tablet Take 1 tablet (15 mg total) by mouth daily. Take with 60 mg, total 75 90 tablet 3  . thyroid (ARMOUR THYROID) 60 MG tablet Take 1 tablet (60 mg total) by mouth daily before breakfast.  Take with 15 mg, total 75 90 tablet 3  . triamcinolone ointment (KENALOG) 0.1 % Apply 1 application topically 2 (two) times daily. 30 g 0   No current facility-administered medications on file prior to visit.    Review of Systems:  As per HPI- otherwise negative.   Physical Examination: There were no vitals filed for this visit. There were no vitals filed for this visit. There is no height or weight on file to calculate BMI. Ideal Body Weight:    GEN: no acute distress. HEENT: Atraumatic, Normocephalic.  Ears and Nose: No external deformity. CV: RRR, No M/G/R. No JVD. No thrill. No extra heart sounds. PULM: CTA B, no wheezes, crackles, rhonchi. No retractions. No resp. distress. No accessory muscle use. ABD: S, NT, ND, +BS. No rebound. No HSM. EXTR: No c/c/e PSYCH: Normally interactive. Conversant.    Assessment and Plan: *** This visit occurred during the SARS-CoV-2 public health emergency.  Safety protocols were in place, including screening questions prior to the visit, additional usage of staff PPE, and extensive cleaning of exam room while observing appropriate contact time as indicated for disinfecting solutions.    Signed Lamar Blinks, MD

## 2019-09-03 ENCOUNTER — Other Ambulatory Visit: Payer: Self-pay

## 2019-09-04 ENCOUNTER — Other Ambulatory Visit: Payer: Self-pay

## 2019-09-04 ENCOUNTER — Encounter: Payer: Self-pay | Admitting: Family Medicine

## 2019-09-04 ENCOUNTER — Ambulatory Visit (INDEPENDENT_AMBULATORY_CARE_PROVIDER_SITE_OTHER): Payer: 59 | Admitting: Family Medicine

## 2019-09-04 VITALS — BP 100/70 | HR 96 | Temp 97.2°F | Resp 18 | Ht 62.0 in | Wt 161.2 lb

## 2019-09-04 DIAGNOSIS — R319 Hematuria, unspecified: Secondary | ICD-10-CM

## 2019-09-04 DIAGNOSIS — N390 Urinary tract infection, site not specified: Secondary | ICD-10-CM | POA: Diagnosis not present

## 2019-09-04 DIAGNOSIS — R350 Frequency of micturition: Secondary | ICD-10-CM | POA: Diagnosis not present

## 2019-09-04 LAB — POC URINALSYSI DIPSTICK (AUTOMATED)
Bilirubin, UA: NEGATIVE
Blood, UA: NEGATIVE
Glucose, UA: POSITIVE — AB
Ketones, UA: NEGATIVE
Nitrite, UA: POSITIVE
Protein, UA: NEGATIVE
Spec Grav, UA: 1.015 (ref 1.010–1.025)
Urobilinogen, UA: 1 E.U./dL
pH, UA: 5 (ref 5.0–8.0)

## 2019-09-04 MED ORDER — CIPROFLOXACIN HCL 250 MG PO TABS: 250.0000 mg | ORAL_TABLET | Freq: Two times a day (BID) | ORAL | 0 refills | Status: DC

## 2019-09-04 NOTE — Progress Notes (Signed)
Patient ID: Sherri Cross, female    DOB: 1971-02-15  Age: 49 y.o. MRN: [id number]    Subjective:  Subjective  HPI Sherri Cross presents for uti symptoms since 3/14----  It went away for a few day and then came back ----she called tele doc and she was put on macrobid and finished it Sat----- the symptoms came back   Her symptoms are burning and frequency ---- no flank pain, no fever   Review of Systems  Constitutional: Negative for appetite change, diaphoresis, fatigue and unexpected weight change.  Eyes: Negative for pain, redness and visual disturbance.  Respiratory: Negative for cough, chest tightness, shortness of breath and wheezing.   Cardiovascular: Negative for chest pain, palpitations and leg swelling.  Endocrine: Negative for cold intolerance, heat intolerance, polydipsia, polyphagia and polyuria.  Genitourinary: Positive for dysuria and frequency. Negative for decreased urine volume, difficulty urinating, flank pain, hematuria, pelvic pain, urgency, vaginal bleeding, vaginal discharge and vaginal pain.  Neurological: Negative for dizziness, light-headedness, numbness and headaches.    History Past Medical History:  Diagnosis Date  . Anemia    iron  . Chronic fatigue syndrome with fibromyalgia 08/10/2016  . Complication of anesthesia   . Diabetes mellitus type 2, uncontrolled, without complications    type 2  . Fibromyalgia   . Hypothyroidism   . Panic attacks 10/21/2015  . PONV (postoperative nausea and vomiting)   . Refusal of blood products   . Thyroid disease     She has a past surgical history that includes Enometrial; Polypectomy; and Laparoscopic vaginal hysterectomy with salpingectomy (Bilateral, 04/14/2019).   Her family history includes Diabetes in her father; Hypertension in her mother.She reports that she has never smoked. She has never used smokeless tobacco. She reports that she does not drink alcohol or use drugs.  Current Outpatient Medications on  File Prior to Visit  Medication Sig Dispense Refill  . amphetamine-dextroamphetamine (ADDERALL) 10 MG tablet Take 1 tablet (10 mg total) by mouth 2 (two) times daily with a meal. Ok to fill 30 days after rx 60 tablet 0  . cyclobenzaprine (FLEXERIL) 10 MG tablet TAKE 1/2 TO 1 TABLET BY  MOUTH 2 TIMES DAILY AS  NEEDED FOR MUSCLE SPASM 60 tablet 2  . dapagliflozin propanediol (FARXIGA) 10 MG TABS tablet Take 10 mg by mouth daily. 90 tablet 3  . fluticasone (FLONASE) 50 MCG/ACT nasal spray Place 2 sprays into both nostrils daily.    Marland Kitchen lamoTRIgine (LAMICTAL) 150 MG tablet Take 150 mg by mouth daily.    . metFORMIN (GLUCOPHAGE) 1000 MG tablet TAKE 1 TABLET BY MOUTH TWO  TIMES DAILY WITH MEALS (Patient taking differently: Take 1,000 mg by mouth 2 (two) times daily with a meal. ) 180 tablet 3  . rosuvastatin (CRESTOR) 20 MG tablet TAKE 1 TABLET BY MOUTH  DAILY (Patient taking differently: Take 20 mg by mouth daily. ) 90 tablet 3  . sitaGLIPtin (JANUVIA) 100 MG tablet Take 1 tablet (100 mg total) by mouth daily. TAKE 1 TABLET BY MOUTH ONCE DAILY 90 tablet 1  . thyroid (ARMOUR THYROID) 15 MG tablet Take 1 tablet (15 mg total) by mouth daily. Take with 60 mg, total 75 90 tablet 3  . thyroid (ARMOUR THYROID) 60 MG tablet Take 1 tablet (60 mg total) by mouth daily before breakfast. Take with 15 mg, total 75 90 tablet 3  . triamcinolone ointment (KENALOG) 0.1 % Apply 1 application topically 2 (two) times daily. 30 g 0   No  current facility-administered medications on file prior to visit.     Objective:  Objective  Physical Exam Vitals and nursing note reviewed.  Constitutional:      Appearance: She is well-developed.  HENT:     Head: Normocephalic and atraumatic.  Eyes:     Conjunctiva/sclera: Conjunctivae normal.  Neck:     Thyroid: No thyromegaly.     Vascular: No carotid bruit or JVD.  Cardiovascular:     Rate and Rhythm: Normal rate and regular rhythm.     Heart sounds: Normal heart sounds. No  murmur.  Pulmonary:     Effort: Pulmonary effort is normal. No respiratory distress.     Breath sounds: Normal breath sounds. No wheezing or rales.  Chest:     Chest wall: No tenderness.  Abdominal:     Tenderness: There is no abdominal tenderness. There is no right CVA tenderness, left CVA tenderness, guarding or rebound.  Musculoskeletal:     Cervical back: Normal range of motion and neck supple.  Neurological:     Mental Status: She is alert and oriented to person, place, and time.    BP 100/70 (BP Location: Left Arm, Patient Position: Sitting, Cuff Size: Normal)   Pulse 96   Temp (!) 97.2 F (36.2 C) (Temporal)   Resp 18   Ht 5\' 2"  (1.575 m)   Wt 161 lb 3.2 oz (73.1 kg)   SpO2 96%   BMI 29.48 kg/m  Wt Readings from Last 3 Encounters:  09/04/19 161 lb 3.2 oz (73.1 kg)  07/14/19 166 lb (75.3 kg)  06/04/19 176 lb (79.8 kg)     Lab Results  Component Value Date   WBC 13.5 (H) 04/15/2019   HGB 13.5 04/15/2019   HCT 41.2 04/15/2019   PLT 301 04/15/2019   GLUCOSE 146 (H) 07/14/2019   CHOL 145 07/14/2019   TRIG 87.0 07/14/2019   HDL 49.90 07/14/2019   LDLCALC 78 07/14/2019   ALT 23 07/14/2019   AST 20 07/14/2019   NA 138 07/14/2019   K 4.0 07/14/2019   CL 101 07/14/2019   CREATININE 0.69 07/14/2019   BUN 6 07/14/2019   CO2 27 07/14/2019   TSH 0.01 (L) 07/14/2019   HGBA1C 7.0 (H) 07/14/2019   MICROALBUR 4.1 (H) 07/14/2019    No results found.   Assessment & Plan:  Plan  I am having [name] "[name]" start on ciprofloxacin. I am also having her maintain her amphetamine-dextroamphetamine, metFORMIN, rosuvastatin, triamcinolone ointment, lamoTRIgine, fluticasone, sitaGLIPtin, dapagliflozin propanediol, cyclobenzaprine, thyroid, and thyroid.  Meds ordered this encounter  Medications  . ciprofloxacin (CIPRO) 250 MG tablet    Sig: Take 1 tablet (250 mg total) by mouth 2 (two) times daily.    Dispense:  10 tablet    Refill:  0    Problem List Items  Addressed This Visit    None    Visit Diagnoses    Urinary frequency    -  Primary   Relevant Orders   POCT Urinalysis Dipstick (Automated) (Completed)   Urine Culture   Urinary tract infection with hematuria, site unspecified       Relevant Medications   ciprofloxacin (CIPRO) 250 MG tablet    drink plenty of water Take abx Call or rto prn Culture pending Follow-up: Return if symptoms worsen or fail to improve.  Ann Held, DO

## 2019-09-04 NOTE — Patient Instructions (Signed)

## 2019-09-05 LAB — URINE CULTURE
MICRO NUMBER:: 10317946
SPECIMEN QUALITY:: ADEQUATE

## 2019-09-08 ENCOUNTER — Telehealth: Payer: Self-pay

## 2019-09-08 NOTE — Telephone Encounter (Signed)
Nurse Assessment Nurse: Durene Cal, RN, Brandi Date/Time (Eastern Time): 09/05/2019 2:27:37 PM Confirm and document reason for call. If symptomatic, describe symptoms. ---Caller reports seen in the office yesterday for UTI and being treated with abx ciprofloxacin acl 250mg  BID for 5 days. Now reports nausea for about 2 weeks due to being on a abx prior to this cycle. Has the patient had close contact with a person known or suspected to have the novel coronavirus illness OR traveled / lives in area with major community spread (including international travel) in the last 14 days from the onset of symptoms? * If Asymptomatic, screen for exposure and travel within the last 14 days. ---No Does the patient have any new or worsening symptoms? ---Yes Will a triage be completed? ---Yes Related visit to physician within the last 2 weeks? ---Yes Does the PT have any chronic conditions? (i.e. diabetes, asthma, this includes High risk factors for pregnancy, etc.) ---Yes List chronic conditions. ---diabetic Is the patient pregnant or possibly pregnant? (Ask all females between the ages of 36-55) ---No Is this a behavioral health or substance abuse call? ---No Nurse: Durene Cal, RN, Brandi Date/Time (Eastern Time): 09/05/2019 2:36:43 PM Please select the assessment type ---Standing order Additional Documentation ---Pharmacy: [facility] @ [phone] NKDA Zofran 4mg  tablet for nausea. Other current medications? ---UnknownPLEASE NOTE: All timestamps contained within this report are represented as [place] Standard Time. CONFIDENTIALTY NOTICE: This fax transmission is intended only for the addressee. It contains information that is legally privileged, confidential or otherwise protected from use or disclosure. If you are not the intended recipient, you are strictly prohibited from reviewing, disclosing, copying using or disseminating any of this information or taking any action in reliance  on or regarding this information. If you have received this fax in error, please notify us immediately by telephone so that we can arrange for its return to [place]. Phone: [phone], Toll-Free: [phone], Fax: [phone] Page: 2 of 3 Call Id: [id number] Nurse Assessment Medication allergies? ---No Guidelines Guideline Title Affirmed Question Affirmed Notes Nurse Date/Time Eilene Ghazi Time) Nausea Taking prescription medication that could cause nausea (e.g., narcotics/opiates, antibiotics, OCPs, many others) Durene Cal, RN, Brandi 09/05/2019 2:31:23 PM Disp. Time Eilene Ghazi Time) Disposition Final User 09/05/2019 2:15:37 PM Send To RN Personal Jimmye Norman, RN, Olin Hauser 09/05/2019 2:43:31 PM Pharmacy Call Durene Cal, RN, Velna Hatchet Reason: RX standing order called in 09/05/2019 2:36:12 PM Call PCP within 24 Hours Yes Durene Cal, RN, Velna Hatchet Caller Disagree/Comply Comply Caller Understands Yes PreDisposition [place] [facility] Advice Given Per Guideline * You become worse. CALL BACK IF: CALL PCP WITHIN 24 HOURS: * Sip water or rehydration liquid (Gatorade or Powerade) * Gradually return to a normal diet. Standing Orders Preparation Additional Instructions Route Frequency Duration Nurse Comments User Name Zofran 4mg  (regular tablet or ODT) 1-2 tablets as needed, # 6 Oral Every 8 Hours Durene Cal, RN, Brandi Referrals GO TO FACILITY [facility]

## 2019-09-24 ENCOUNTER — Telehealth: Payer: Self-pay

## 2019-09-24 NOTE — Telephone Encounter (Signed)
PA approved.   Request Reference Number: [id number]. ARMOUR THYRO TAB 60MG  is approved through 09/23/2020. Your patient may now fill this prescription and it will be covered.

## 2019-09-24 NOTE — Telephone Encounter (Signed)
PA initiated via Covermymeds; KEY: [id number]. Awaiting determination.

## 2019-09-30 ENCOUNTER — Encounter: Payer: Self-pay | Admitting: Family Medicine

## 2019-09-30 ENCOUNTER — Telehealth: Payer: Self-pay | Admitting: Family Medicine

## 2019-09-30 MED ORDER — LAMOTRIGINE 150 MG PO TABS: 150.0000 mg | ORAL_TABLET | Freq: Every day | ORAL | 0 refills | Status: DC

## 2019-09-30 NOTE — Telephone Encounter (Signed)
Patient states that her therapist has left the [facility], pt is requesting a medication re-fill of medication for 90 day supply until she finds a new therapist.   lamoTRIgine (LAMICTAL) 150 MG tablet [id number]   [place], [place] [employer] Phone:  [phone]  Fax:  [phone]       Please advise

## 2019-10-01 ENCOUNTER — Other Ambulatory Visit (INDEPENDENT_AMBULATORY_CARE_PROVIDER_SITE_OTHER): Payer: 59

## 2019-10-01 ENCOUNTER — Other Ambulatory Visit: Payer: Self-pay

## 2019-10-01 DIAGNOSIS — E034 Atrophy of thyroid (acquired): Secondary | ICD-10-CM

## 2019-10-01 LAB — TSH: TSH: 0.02 u[IU]/mL — ABNORMAL LOW (ref 0.35–4.50)

## 2019-10-02 ENCOUNTER — Encounter: Payer: Self-pay | Admitting: Family Medicine

## 2019-10-02 ENCOUNTER — Other Ambulatory Visit: Payer: Self-pay | Admitting: Family Medicine

## 2019-10-02 DIAGNOSIS — E034 Atrophy of thyroid (acquired): Secondary | ICD-10-CM

## 2019-11-09 ENCOUNTER — Other Ambulatory Visit: Payer: Self-pay | Admitting: Family Medicine

## 2019-11-09 DIAGNOSIS — E119 Type 2 diabetes mellitus without complications: Secondary | ICD-10-CM

## 2019-11-25 ENCOUNTER — Other Ambulatory Visit: Payer: Self-pay | Admitting: Family Medicine

## 2020-01-13 ENCOUNTER — Encounter: Payer: 59 | Admitting: Family Medicine

## 2020-02-08 ENCOUNTER — Other Ambulatory Visit: Payer: Self-pay | Admitting: Family Medicine

## 2020-02-08 DIAGNOSIS — E785 Hyperlipidemia, unspecified: Secondary | ICD-10-CM

## 2020-03-08 ENCOUNTER — Other Ambulatory Visit: Payer: Self-pay | Admitting: Family Medicine

## 2020-03-08 DIAGNOSIS — E1165 Type 2 diabetes mellitus with hyperglycemia: Secondary | ICD-10-CM

## 2020-03-12 ENCOUNTER — Other Ambulatory Visit: Payer: Self-pay

## 2020-03-12 ENCOUNTER — Ambulatory Visit (INDEPENDENT_AMBULATORY_CARE_PROVIDER_SITE_OTHER): Payer: 59

## 2020-03-12 ENCOUNTER — Other Ambulatory Visit: Payer: 59

## 2020-03-12 DIAGNOSIS — Z23 Encounter for immunization: Secondary | ICD-10-CM | POA: Diagnosis not present

## 2020-03-12 DIAGNOSIS — E034 Atrophy of thyroid (acquired): Secondary | ICD-10-CM

## 2020-03-12 LAB — TSH: TSH: 0.16 mIU/L — ABNORMAL LOW

## 2020-03-12 NOTE — Progress Notes (Signed)
Pre visit review using our clinic review tool, if applicable. No additional management support is needed unless otherwise documented below in the visit note.  Patient here for flu vaccine. 0.5mL flu vaccine given in left deltoid IM. Patient tolerated well. VIS given.   

## 2020-03-12 NOTE — Addendum Note (Signed)
Addended by: Angelina Pih on: 03/12/2020 10:08 AM   Modules accepted: Orders

## 2020-03-13 ENCOUNTER — Encounter: Payer: Self-pay | Admitting: Family Medicine

## 2020-03-13 DIAGNOSIS — E034 Atrophy of thyroid (acquired): Secondary | ICD-10-CM

## 2020-03-14 MED ORDER — THYROID 15 MG PO TABS: 45.0000 mg | ORAL_TABLET | Freq: Every day | ORAL | 3 refills | Status: DC

## 2020-03-24 ENCOUNTER — Telehealth: Payer: Self-pay | Admitting: Family Medicine

## 2020-03-24 DIAGNOSIS — E034 Atrophy of thyroid (acquired): Secondary | ICD-10-CM

## 2020-03-24 MED ORDER — THYROID 15 MG PO TABS: 45.0000 mg | ORAL_TABLET | Freq: Every day | ORAL | 3 refills | Status: DC

## 2020-03-24 NOTE — Telephone Encounter (Signed)
Patient states this [facility] should [facility] thyroid (ARMOUR THYROID) 15 MG tablet

## 2020-03-24 NOTE — Telephone Encounter (Signed)
Medication resent to correct pharmacy 

## 2020-05-20 ENCOUNTER — Other Ambulatory Visit: Payer: Self-pay

## 2020-05-20 ENCOUNTER — Other Ambulatory Visit (INDEPENDENT_AMBULATORY_CARE_PROVIDER_SITE_OTHER): Payer: 59

## 2020-05-20 DIAGNOSIS — E034 Atrophy of thyroid (acquired): Secondary | ICD-10-CM

## 2020-05-20 LAB — TSH: TSH: 0.71 u[IU]/mL (ref 0.35–4.50)

## 2020-05-20 NOTE — Addendum Note (Signed)
Addended by: Kelle Darting A on: 05/20/2020 08:18 AM   Modules accepted: Orders

## 2020-05-28 ENCOUNTER — Other Ambulatory Visit: Payer: Self-pay | Admitting: Family Medicine

## 2020-05-28 DIAGNOSIS — E034 Atrophy of thyroid (acquired): Secondary | ICD-10-CM

## 2020-06-29 ENCOUNTER — Telehealth: Payer: Self-pay | Admitting: Family Medicine

## 2020-06-29 MED ORDER — LAMOTRIGINE 150 MG PO TABS: 150.0000 mg | ORAL_TABLET | Freq: Every day | ORAL | 1 refills | Status: DC

## 2020-06-29 MED ORDER — LAMOTRIGINE 150 MG PO TABS: 150.0000 mg | ORAL_TABLET | Freq: Every day | ORAL | 0 refills | Status: DC

## 2020-06-29 NOTE — Telephone Encounter (Signed)
New pharmacy  Medication: lamoTRIgine (LAMICTAL) 150 MG tablet [[id number]]       Has the patient contacted their pharmacy?  (If no, request that the patient contact the pharmacy for the refill.) (If yes, when and what did the pharmacy advise?)     Preferred Pharmacy (with phone number or street name): [facility] [place], [place]: Please be advised that RX refills may take up to 3 business days. We ask that you follow-up with your pharmacy.

## 2020-06-29 NOTE — Telephone Encounter (Signed)
Patient call back  she wants prescription for 90 days and also send to    [facility] [place], [place], [place] [address]

## 2020-06-29 NOTE — Telephone Encounter (Signed)
Medication sent to pharmacy  

## 2020-08-24 ENCOUNTER — Ambulatory Visit: Payer: 59 | Admitting: Medical

## 2020-08-26 ENCOUNTER — Other Ambulatory Visit: Payer: Self-pay

## 2020-08-26 ENCOUNTER — Encounter: Payer: Self-pay | Admitting: Internal Medicine

## 2020-08-26 ENCOUNTER — Ambulatory Visit (INDEPENDENT_AMBULATORY_CARE_PROVIDER_SITE_OTHER): Payer: 59 | Admitting: Internal Medicine

## 2020-08-26 VITALS — BP 118/80 | HR 97 | Temp 98.1°F | Ht 62.0 in | Wt 134.4 lb

## 2020-08-26 DIAGNOSIS — R319 Hematuria, unspecified: Secondary | ICD-10-CM | POA: Diagnosis not present

## 2020-08-26 DIAGNOSIS — N390 Urinary tract infection, site not specified: Secondary | ICD-10-CM

## 2020-08-26 LAB — POC URINALSYSI DIPSTICK (AUTOMATED)
Bilirubin, UA: NEGATIVE
Glucose, UA: POSITIVE — AB
Ketones, UA: NEGATIVE
Leukocytes, UA: NEGATIVE
Nitrite, UA: NEGATIVE
Protein, UA: POSITIVE — AB
Spec Grav, UA: 1.015 (ref 1.010–1.025)
Urobilinogen, UA: 0.2 E.U./dL
pH, UA: 6 (ref 5.0–8.0)

## 2020-08-26 MED ORDER — SULFAMETHOXAZOLE-TRIMETHOPRIM 800-160 MG PO TABS: 1.0000 | ORAL_TABLET | Freq: Two times a day (BID) | ORAL | 0 refills | Status: DC

## 2020-08-26 NOTE — Patient Instructions (Signed)
Drink plenty fluids  Take Bactrim for 3 days  If you are not gradually better let us know

## 2020-08-26 NOTE — Progress Notes (Signed)
Subjective:    Patient ID: Sherri Cross, female    DOB: [birth year], 50 y.o.   MRN: [id number]  DOS:  08/26/2020 Type of visit - description: Acute visit  On and off symptoms for 4 days, mostly dysuria. In the past, she had protracted symptoms and likes to be treated promptly.   Review of Systems Denies any fever chills Has chronic nausea, no vomiting. No gross hematuria, urinary frequency or urgency.   Past Medical History:  Diagnosis Date  . Anemia    iron  . Chronic fatigue syndrome with fibromyalgia 08/10/2016  . Complication of anesthesia   . Diabetes mellitus type 2, uncontrolled, without complications    type 2  . Fibromyalgia   . Hypothyroidism   . Panic attacks 10/21/2015  . PONV (postoperative nausea and vomiting)   . Refusal of blood products   . Thyroid disease     Past Surgical History:  Procedure Laterality Date  . Enometrial    . LAPAROSCOPIC VAGINAL HYSTERECTOMY WITH SALPINGECTOMY Bilateral 04/14/2019   Procedure: LAPAROSCOPIC ASSISTED VAGINAL HYSTERECTOMY WITH SALPINGO-OOPHORECTOMY;  Surgeon: Crawford Givens, MD;  Location: [facility];  Service: Gynecology;  Laterality: Bilateral;  . POLYPECTOMY     Uterus    Allergies as of 08/26/2020      Reactions   Blood-group Specific Substance    Jehovah's witness. Refuses all blood products except albumin. Erasmo Downer, MD (04/14/19)      Medication List       Accurate as of August 26, 2020 11:59 PM. If you have any questions, ask your nurse or doctor.        STOP taking these medications   ciprofloxacin 250 MG tablet Commonly known as: Cipro Stopped by: Kathlene November, MD     TAKE these medications   amphetamine-dextroamphetamine 10 MG tablet Commonly known as: ADDERALL Take 1 tablet (10 mg total) by mouth 2 (two) times daily with a meal. Ok to fill 30 days after rx   cyclobenzaprine 10 MG tablet Commonly known as: FLEXERIL TAKE 1/2 TO 1 TABLET BY  MOUTH 2 TIMES DAILY AS  NEEDED FOR  MUSCLE SPASM   Farxiga 10 MG Tabs tablet Generic drug: dapagliflozin propanediol TAKE 10 MG BY MOUTH DAILY   fluticasone 50 MCG/ACT nasal spray Commonly known as: FLONASE Place 2 sprays into both nostrils daily.   Januvia 100 MG tablet Generic drug: sitaGLIPtin TAKE 1 TABLET BY MOUTH  DAILY   lamoTRIgine 150 MG tablet Commonly known as: LAMICTAL Take 1 tablet (150 mg total) by mouth daily.   metFORMIN 1000 MG tablet Commonly known as: GLUCOPHAGE Take 1 tablet (1,000 mg total) by mouth 2 (two) times daily with a meal.   rosuvastatin 20 MG tablet Commonly known as: CRESTOR TAKE 1 TABLET BY MOUTH  DAILY   sulfamethoxazole-trimethoprim 800-160 MG tablet Commonly known as: BACTRIM DS Take 1 tablet by mouth 2 (two) times daily. Started by: Kathlene November, MD   thyroid 15 MG tablet Commonly known as: Armour Thyroid Take 3 tablets (45 mg total) by mouth daily.   triamcinolone ointment 0.1 % Commonly known as: KENALOG Apply 1 application topically 2 (two) times daily.          Objective:   Physical Exam BP 118/80 (BP Location: Right Arm, Patient Position: Sitting, Cuff Size: Large)   Pulse 97   Temp 98.1 F (36.7 C) (Temporal)   Ht 5\' 2"  (1.575 m)   Wt 134 lb 6.4 oz (61 kg)   SpO2  98%   BMI 24.58 kg/m  General:   Well developed, NAD, BMI noted. HEENT:  Normocephalic . Face symmetric, atraumatic Abdomen: Not distended, soft, not tender.  No CVA tenderness. Lower extremities: no pretibial edema bilaterally  Skin: Not pale. Not jaundice Neurologic:  alert & oriented X3.  Speech normal, gait appropriate for age and unassisted Psych--  Cognition and judgment appear intact.  Cooperative with normal attention span and concentration.  Behavior appropriate. No anxious or depressed appearing.      Assessment     50 year old female, PMH includes depression, DM, anxiety, thyroid disease, high cholesterol,   on Adderall among other medications, s/p hysterectomy with  bilateral salpingectomy, presents with:  UTI SX c/w UTI, U dip: + blood. Plan: Push fluids, empiric Bactrim for 3 days, call if not better. Also, recommend to see PCP for management of DM, sugar noted in the urine.   This visit occurred during the SARS-CoV-2 public health emergency.  Safety protocols were in place, including screening questions prior to the visit, additional usage of staff PPE, and extensive cleaning of exam room while observing appropriate contact time as indicated for disinfecting solutions.

## 2020-08-27 LAB — URINALYSIS, ROUTINE W REFLEX MICROSCOPIC
Bilirubin Urine: NEGATIVE
Ketones, ur: NEGATIVE
Leukocytes,Ua: NEGATIVE
Nitrite: NEGATIVE
Specific Gravity, Urine: 1.01 (ref 1.000–1.030)
Total Protein, Urine: NEGATIVE
Urine Glucose: >=1000 — AB
Urobilinogen, UA: 0.2 (ref 0.0–1.0)
pH: 5.5 (ref 5.0–8.0)

## 2020-08-29 ENCOUNTER — Other Ambulatory Visit: Payer: Self-pay | Admitting: Internal Medicine

## 2020-08-29 LAB — URINE CULTURE
MICRO NUMBER:: 11687997
SPECIMEN QUALITY:: ADEQUATE

## 2020-08-29 MED ORDER — NITROFURANTOIN MONOHYD MACRO 100 MG PO CAPS: 100.0000 mg | ORAL_CAPSULE | Freq: Two times a day (BID) | ORAL | 0 refills | Status: DC

## 2020-09-06 ENCOUNTER — Telehealth: Payer: Self-pay | Admitting: Family Medicine

## 2020-09-06 NOTE — Telephone Encounter (Signed)
Patient would like a substitute for  Farxiga for 7 days please send to  [facility], [place] [employer]. [address] Phone:  [phone]  Fax:  [phone]

## 2020-09-06 NOTE — Telephone Encounter (Signed)
Can you clarify what this means? Does she need the farxiga sent for 7 days or different med?

## 2020-09-09 ENCOUNTER — Encounter: Payer: Self-pay | Admitting: Internal Medicine

## 2020-09-09 ENCOUNTER — Other Ambulatory Visit: Payer: Self-pay

## 2020-09-09 DIAGNOSIS — R252 Cramp and spasm: Secondary | ICD-10-CM

## 2020-09-09 DIAGNOSIS — S43402A Unspecified sprain of left shoulder joint, initial encounter: Secondary | ICD-10-CM

## 2020-09-09 MED ORDER — CYCLOBENZAPRINE HCL 10 MG PO TABS: 10.0000 mg | ORAL_TABLET | Freq: Two times a day (BID) | ORAL | 2 refills | Status: DC | PRN

## 2020-09-09 NOTE — Telephone Encounter (Signed)
Received refill request from patients pharmacy on flexeril. Please refill/advise if appropriate

## 2020-09-10 MED ORDER — AMOXICILLIN-POT CLAVULANATE 875-125 MG PO TABS: 1.0000 | ORAL_TABLET | Freq: Two times a day (BID) | ORAL | 0 refills | Status: DC

## 2020-09-24 NOTE — Patient Instructions (Incomplete)
It was very nice to see you again today, I will be in touch with your labs as soon as possible It looks like you may be due for an eye exam

## 2020-09-24 NOTE — Progress Notes (Deleted)
[place] at [facility] [address] [place], [place] [address] [phone]  Date:  09/27/2020   Name:  Sherri Cross   DOB:  07-23-1970   MRN:  [id number]  PCP:  Darreld Mclean, MD    Chief Complaint: No chief complaint on file.   History of Present Illness:  Sherri Cross is a 50 y.o. very pleasant female patient who presents with the following:  Here today for follow-up visit Last seen by myself about 1 year ago She was seen by Dr. Larose Kells more recently with urinary tract infection History of diabetes, chronic fatigue, hypothyroidism, insomnia/anxiety   At her last visit she was doing some walking, has lost weight and seemed in better spirits  Hep C and HIV screening Colon cancer screening Eye exam A1c is due Foot exam due COVID-19 Mammogram? Thyroid on chart from December, otherwise labs catch up as needed  Farxiga 10 Januvia Metformin 1000 twice daily Crestor 10 Armour Thyroid 45 mg Lamictal Patient Active Problem List   Diagnosis Date Noted  . Uncontrolled type 2 diabetes mellitus with hyperglycemia ([facility]) 06/04/2019  . Menorrhagia 04/14/2019  . Chronic pelvic pain in female 04/14/2019  . Anxiety hyperventilation 11/05/2016  . Psychophysiological insomnia 11/05/2016  . Chronic fatigue syndrome with fibromyalgia 08/10/2016  . Insomnia 10/21/2015  . Anxiety disorder, unspecified 10/21/2015  . Hypothyroidism due to acquired atrophy of thyroid 10/21/2015  . Panic attacks 10/21/2015    Past Medical History:  Diagnosis Date  . Anemia    iron  . Chronic fatigue syndrome with fibromyalgia 08/10/2016  . Complication of anesthesia   . Diabetes mellitus type 2, uncontrolled, without complications    type 2  . Fibromyalgia   . Hypothyroidism   . Panic attacks 10/21/2015  . PONV (postoperative nausea and vomiting)   . Refusal of blood products   . Thyroid disease     Past Surgical History:   Procedure Laterality Date  . Enometrial    . LAPAROSCOPIC VAGINAL HYSTERECTOMY WITH SALPINGECTOMY Bilateral 04/14/2019   Procedure: LAPAROSCOPIC ASSISTED VAGINAL HYSTERECTOMY WITH SALPINGO-OOPHORECTOMY;  Surgeon: Crawford Givens, MD;  Location: [facility];  Service: Gynecology;  Laterality: Bilateral;  . POLYPECTOMY     Uterus    Social History   Tobacco Use  . Smoking status: Never Smoker  . Smokeless tobacco: Never Used  Vaping Use  . Vaping Use: Never used  Substance Use Topics  . Alcohol use: No  . Drug use: No    Family History  Problem Relation Age of Onset  . Hypertension Mother   . Diabetes Father     Allergies  Allergen Reactions  . Blood-Group Specific Substance     Jehovah's witness. Refuses all blood products except albumin. Erasmo Downer, MD (04/14/19)    Medication list has been reviewed and updated.  Current Outpatient Medications on File Prior to Visit  Medication Sig Dispense Refill  . amoxicillin-clavulanate (AUGMENTIN) 875-125 MG tablet Take 1 tablet by mouth 2 (two) times daily. 10 tablet 0  . amphetamine-dextroamphetamine (ADDERALL) 10 MG tablet Take 1 tablet (10 mg total) by mouth 2 (two) times daily with a meal. Ok to fill 30 days after rx 60 tablet 0  . cyclobenzaprine (FLEXERIL) 10 MG tablet Take 1 tablet (10 mg total) by mouth 2 (two) times daily as needed for muscle spasms. 60 tablet 2  . FARXIGA 10 MG TABS tablet TAKE 10 MG BY MOUTH DAILY 90  tablet 3  . fluticasone (FLONASE) 50 MCG/ACT nasal spray Place 2 sprays into both nostrils daily.    Marland Kitchen JANUVIA 100 MG tablet TAKE 1 TABLET BY MOUTH  DAILY 90 tablet 3  . lamoTRIgine (LAMICTAL) 150 MG tablet Take 1 tablet (150 mg total) by mouth daily. 90 tablet 0  . metFORMIN (GLUCOPHAGE) 1000 MG tablet Take 1 tablet (1,000 mg total) by mouth 2 (two) times daily with a meal. 180 tablet 1  . nitrofurantoin, macrocrystal-monohydrate, (MACROBID) 100 MG capsule Take 1 capsule (100 mg total) by mouth 2  (two) times daily. 6 capsule 0  . rosuvastatin (CRESTOR) 20 MG tablet TAKE 1 TABLET BY MOUTH  DAILY 90 tablet 3  . thyroid (ARMOUR THYROID) 15 MG tablet Take 3 tablets (45 mg total) by mouth daily. 270 tablet 0  . triamcinolone ointment (KENALOG) 0.1 % Apply 1 application topically 2 (two) times daily. 30 g 0   No current facility-administered medications on file prior to visit.    Review of Systems:  As per HPI- otherwise negative.   Physical Examination: There were no vitals filed for this visit. There were no vitals filed for this visit. There is no height or weight on file to calculate BMI. Ideal Body Weight:    GEN: no acute distress. HEENT: Atraumatic, Normocephalic.  Ears and Nose: No external deformity. CV: RRR, No M/G/R. No JVD. No thrill. No extra heart sounds. PULM: CTA B, no wheezes, crackles, rhonchi. No retractions. No resp. distress. No accessory muscle use. ABD: S, NT, ND, +BS. No rebound. No HSM. EXTR: No c/c/e PSYCH: Normally interactive. Conversant.  Foot exam  Assessment and Plan: *** This visit occurred during the SARS-CoV-2 public health emergency.  Safety protocols were in place, including screening questions prior to the visit, additional usage of staff PPE, and extensive cleaning of exam room while observing appropriate contact time as indicated for disinfecting solutions.    Signed Lamar Blinks, MD

## 2020-09-27 ENCOUNTER — Encounter: Payer: 59 | Admitting: Family Medicine

## 2020-09-27 DIAGNOSIS — F341 Dysthymic disorder: Secondary | ICD-10-CM

## 2020-09-27 DIAGNOSIS — E034 Atrophy of thyroid (acquired): Secondary | ICD-10-CM

## 2020-09-27 DIAGNOSIS — E785 Hyperlipidemia, unspecified: Secondary | ICD-10-CM

## 2020-09-27 DIAGNOSIS — Z114 Encounter for screening for human immunodeficiency virus [HIV]: Secondary | ICD-10-CM

## 2020-09-27 DIAGNOSIS — Z1159 Encounter for screening for other viral diseases: Secondary | ICD-10-CM

## 2020-09-27 DIAGNOSIS — E119 Type 2 diabetes mellitus without complications: Secondary | ICD-10-CM

## 2020-09-27 DIAGNOSIS — R5383 Other fatigue: Secondary | ICD-10-CM

## 2020-09-28 ENCOUNTER — Telehealth: Payer: Self-pay | Admitting: Family Medicine

## 2020-09-28 MED ORDER — METFORMIN HCL 1000 MG PO TABS: 1000.0000 mg | ORAL_TABLET | Freq: Two times a day (BID) | ORAL | 0 refills | Status: DC

## 2020-09-28 NOTE — Telephone Encounter (Signed)
Medication: metFORMIN (GLUCOPHAGE) 1000 MG tablet [[id number]]    Has the patient contacted their pharmacy? No. (If no, request that the patient contact the pharmacy for the refill.) (If yes, when and what did the pharmacy advise?)  Preferred Pharmacy (with phone number or street name):  [facility], [address], [place], [place], [place] [address]  Phone:  [phone] Fax:  [phone]  DEA #:  [id number]  [id number] Reason: --       Agent: Please be advised that RX refills may take up to 3 business days. We ask that you follow-up with your pharmacy.

## 2020-09-28 NOTE — Addendum Note (Signed)
Addended by: Wynonia Musty A on: 09/28/2020 11:24 AM   Modules accepted: Orders

## 2020-10-05 NOTE — Progress Notes (Addendum)
[facility] [place], [place], [place] [address] [phone]  Date:  10/06/2020   Name:  Sherri Cross   DOB:  06/30/1970   MRN:  [id number]  PCP:  Darreld Mclean, MD    Chief Complaint: Annual Exam (cpe)   History of Present Illness:  Sherri Cross is a 50 y.o. very pleasant female patient who presents with the following:  Patient here today for physical exam Last seen by myself about 14 months ago history of diabetes, chronic fatigue, hypothyroidism, insomnia/anxiety  She underwent a complete hysterectomy for benign disease in November 2020  Colon cancer screening-we will place referral to GI Eye exam- this is due  COVID-19 booster done-reminded patient Foot exam is due Due for urine microalbumin and routine lab work Mammogram is well overdue.  She is willing to have me place referral today  She is continuing to lose weight-she notes that since her hysterectomy she tends to feel nauseated much of the day so she is eating less.  Her nausea tends to be worse in the morning, will abate by mid afternoon She may actually vomit a few times a week She is drinking ginger ale to reduce her symptoms   She is taking metformin BID and janvia still.  I advised her that since she has lost weight we can likely decrease her diabetes medication.  We will check an A1c today    Wt Readings from Last 3 Encounters:  10/06/20 131 lb 12.8 oz (59.8 kg)  08/26/20 134 lb 6.4 oz (61 kg)  09/04/19 161 lb 3.2 oz (73.1 kg)    Patient Active Problem List   Diagnosis Date Noted  . Uncontrolled type 2 diabetes mellitus with hyperglycemia ([place]) 06/04/2019  . Menorrhagia 04/14/2019  . Chronic pelvic pain in female 04/14/2019  . Anxiety hyperventilation 11/05/2016  . Psychophysiological insomnia 11/05/2016  . Chronic fatigue syndrome with fibromyalgia 08/10/2016  . Insomnia 10/21/2015  . Anxiety disorder, unspecified  10/21/2015  . Hypothyroidism due to acquired atrophy of thyroid 10/21/2015  . Panic attacks 10/21/2015    Past Medical History:  Diagnosis Date  . Anemia    iron  . Chronic fatigue syndrome with fibromyalgia 08/10/2016  . Complication of anesthesia   . Diabetes mellitus type 2, uncontrolled, without complications    type 2  . Fibromyalgia   . Hypothyroidism   . Panic attacks 10/21/2015  . PONV (postoperative nausea and vomiting)   . Refusal of blood products   . Thyroid disease     Past Surgical History:  Procedure Laterality Date  . Enometrial    . LAPAROSCOPIC VAGINAL HYSTERECTOMY WITH SALPINGECTOMY Bilateral 04/14/2019   Procedure: LAPAROSCOPIC ASSISTED VAGINAL HYSTERECTOMY WITH SALPINGO-OOPHORECTOMY;  Surgeon: Crawford Givens, MD;  Location: [facility];  Service: Gynecology;  Laterality: Bilateral;  . POLYPECTOMY     Uterus    Social History   Tobacco Use  . Smoking status: Never Smoker  . Smokeless tobacco: Never Used  Vaping Use  . Vaping Use: Never used  Substance Use Topics  . Alcohol use: No  . Drug use: No    Family History  Problem Relation Age of Onset  . Hypertension Mother   . Diabetes Father     Allergies  Allergen Reactions  . Blood-Group Specific Substance     Jehovah's witness. Refuses all blood products except albumin. Erasmo Downer, MD (04/14/19)    Medication list has been  reviewed and updated.  Current Outpatient Medications on File Prior to Visit  Medication Sig Dispense Refill  . amphetamine-dextroamphetamine (ADDERALL) 10 MG tablet Take 1 tablet (10 mg total) by mouth 2 (two) times daily with a meal. Ok to fill 30 days after rx 60 tablet 0  . cyclobenzaprine (FLEXERIL) 10 MG tablet Take 1 tablet (10 mg total) by mouth 2 (two) times daily as needed for muscle spasms. 60 tablet 2  . FARXIGA 10 MG TABS tablet TAKE 10 MG BY MOUTH DAILY 90 tablet 3  . fluticasone (FLONASE) 50 MCG/ACT nasal spray Place 2 sprays into both nostrils  daily.    Marland Kitchen JANUVIA 100 MG tablet TAKE 1 TABLET BY MOUTH  DAILY 90 tablet 3  . lamoTRIgine (LAMICTAL) 150 MG tablet Take 1 tablet (150 mg total) by mouth daily. 90 tablet 0  . metFORMIN (GLUCOPHAGE) 1000 MG tablet Take 1 tablet (1,000 mg total) by mouth 2 (two) times daily with a meal. 60 tablet 0  . rosuvastatin (CRESTOR) 20 MG tablet TAKE 1 TABLET BY MOUTH  DAILY 90 tablet 3  . thyroid (ARMOUR THYROID) 15 MG tablet Take 3 tablets (45 mg total) by mouth daily. 270 tablet 0  . triamcinolone ointment (KENALOG) 0.1 % Apply 1 application topically 2 (two) times daily. 30 g 0   No current facility-administered medications on file prior to visit.    Review of Systems:  As per HPI- otherwise negative.   Physical Examination: Vitals:   10/06/20 1444  BP: 110/70  Pulse: 96  Temp: 98.5 F (36.9 C)  SpO2: 98%   Vitals:   10/06/20 1444  Weight: 131 lb 12.8 oz (59.8 kg)  Height: 5\' 2"  (1.575 m)   Body mass index is 24.11 kg/m. Ideal Body Weight: Weight in (lb) to have BMI = 25: 136.4  GEN: no acute distress.  Looks well, normal weight HEENT: Atraumatic, Normocephalic. Bilateral TM wnl, oropharynx normal.  PEERL,EOMI.   Ears and Nose: No external deformity. CV: RRR, No M/G/R. No JVD. No thrill. No extra heart sounds. PULM: CTA B, no wheezes, crackles, rhonchi. No retractions. No resp. distress. No accessory muscle use. ABD: S, NT, ND, +BS. No rebound. No HSM. EXTR: No c/c/e PSYCH: Normally interactive. Conversant.    Assessment and Plan: Physical exam  Hypothyroidism due to acquired atrophy of thyroid - Plan: TSH  Controlled type 2 diabetes mellitus without complication, without long-term current use of insulin ([place]) - Plan: Comprehensive metabolic panel, Hemoglobin A1c, Microalbumin / creatinine urine ratio, metFORMIN (GLUCOPHAGE) 1000 MG tablet  Dyslipidemia - Plan: Lipid panel  Fatigue, unspecified type - Plan: CBC, TSH, VITAMIN D 25 Hydroxy (Vit-D Deficiency,  Fractures)  Encounter for hepatitis C screening test for low risk patient - Plan: Hepatitis C antibody  Encounter for screening mammogram for malignant neoplasm of breast - Plan: MM 3D SCREEN BREAST BILATERAL  Colon cancer screening  Nausea - Plan: Ambulatory referral to Gastroenterology  Vegetarian diet - Plan: Vitamin B12   Upon receipt of TSH pt would like to change to levothyroxine  Patient today for a physical exam.  Labs are pending as above.  Encouraged healthy diet and exercise routine She has lost weight which is a positive, but she notes an unusual pattern of persistent nausea.  I placed a referral to gastroenterology to discuss this issue and also for colon cancer screening Ordered mammogram Will plan further follow- up pending labs.  This visit occurred during the SARS-CoV-2 public health emergency.  Safety protocols were  in place, including screening questions prior to the visit, additional usage of staff PPE, and extensive cleaning of exam room while observing appropriate contact time as indicated for disinfecting solutions.    Signed Lamar Blinks, MD  addnd 5/6- received her labs as below, message to pt   Results for orders placed or performed in visit on 10/06/20  CBC  Result Value Ref Range   WBC 8.1 4.0 - 10.5 K/uL   RBC 4.80 3.87 - 5.11 Mil/uL   Platelets 298.0 150.0 - 400.0 K/uL   Hemoglobin 14.0 12.0 - 15.0 g/dL   HCT 42.8 36.0 - 46.0 %   MCV 89.2 78.0 - 100.0 fl   MCHC 32.8 30.0 - 36.0 g/dL   RDW 13.9 11.5 - 15.5 %  Comprehensive metabolic panel  Result Value Ref Range   Sodium 141 135 - 145 mEq/L   Potassium 4.6 3.5 - 5.1 mEq/L   Chloride 104 96 - 112 mEq/L   CO2 29 19 - 32 mEq/L   Glucose, Bld 88 70 - 99 mg/dL   BUN 9 6 - 23 mg/dL   Creatinine, Ser 0.58 0.40 - 1.20 mg/dL   Total Bilirubin 0.4 0.2 - 1.2 mg/dL   Alkaline Phosphatase 59 39 - 117 U/L   AST 13 0 - 37 U/L   ALT 11 0 - 35 U/L   Total Protein 7.0 6.0 - 8.3 g/dL   Albumin 4.3 3.5  - 5.2 g/dL   GFR 106.25 >60.00 mL/min   Calcium 9.9 8.4 - 10.5 mg/dL  Hemoglobin A1c  Result Value Ref Range   Hgb A1c MFr Bld 5.8 4.6 - 6.5 %  Lipid panel  Result Value Ref Range   Cholesterol 153 0 - 200 mg/dL   Triglycerides 68.0 0.0 - 149.0 mg/dL   HDL 72.10 >39.00 mg/dL   VLDL 13.6 0.0 - 40.0 mg/dL   LDL Cholesterol 67 0 - 99 mg/dL   Total CHOL/HDL Ratio 2    NonHDL 80.91   Microalbumin / creatinine urine ratio  Result Value Ref Range   Microalb, Ur <0.7 0.0 - 1.9 mg/dL   Creatinine,U 14.1 mg/dL   Microalb Creat Ratio 5.0 0.0 - 30.0 mg/g  TSH  Result Value Ref Range   TSH 0.78 0.35 - 4.50 uIU/mL  VITAMIN D 25 Hydroxy (Vit-D Deficiency, Fractures)  Result Value Ref Range   VITD 48.86 30.00 - 100.00 ng/mL  Hepatitis C antibody  Result Value Ref Range   Hepatitis C Ab NON-REACTIVE NON-REACTIVE   SIGNAL TO CUT-OFF 0.00 <1.00  Vitamin B12  Result Value Ref Range   Vitamin B-12 >1550 (H) 211 - 911 pg/mL

## 2020-10-06 ENCOUNTER — Ambulatory Visit (INDEPENDENT_AMBULATORY_CARE_PROVIDER_SITE_OTHER): Payer: 59 | Admitting: Family Medicine

## 2020-10-06 ENCOUNTER — Other Ambulatory Visit: Payer: Self-pay

## 2020-10-06 ENCOUNTER — Encounter: Payer: Self-pay | Admitting: Family Medicine

## 2020-10-06 VITALS — BP 110/70 | HR 96 | Temp 98.5°F | Ht 62.0 in | Wt 131.8 lb

## 2020-10-06 DIAGNOSIS — R5383 Other fatigue: Secondary | ICD-10-CM | POA: Diagnosis not present

## 2020-10-06 DIAGNOSIS — E034 Atrophy of thyroid (acquired): Secondary | ICD-10-CM | POA: Diagnosis not present

## 2020-10-06 DIAGNOSIS — E785 Hyperlipidemia, unspecified: Secondary | ICD-10-CM | POA: Diagnosis not present

## 2020-10-06 DIAGNOSIS — Z789 Other specified health status: Secondary | ICD-10-CM

## 2020-10-06 DIAGNOSIS — Z0000 Encounter for general adult medical examination without abnormal findings: Secondary | ICD-10-CM | POA: Diagnosis not present

## 2020-10-06 DIAGNOSIS — Z1159 Encounter for screening for other viral diseases: Secondary | ICD-10-CM

## 2020-10-06 DIAGNOSIS — R110 Nausea: Secondary | ICD-10-CM

## 2020-10-06 DIAGNOSIS — E119 Type 2 diabetes mellitus without complications: Secondary | ICD-10-CM | POA: Diagnosis not present

## 2020-10-06 DIAGNOSIS — Z1211 Encounter for screening for malignant neoplasm of colon: Secondary | ICD-10-CM

## 2020-10-06 DIAGNOSIS — Z1231 Encounter for screening mammogram for malignant neoplasm of breast: Secondary | ICD-10-CM

## 2020-10-06 MED ORDER — METFORMIN HCL 1000 MG PO TABS: 1000.0000 mg | ORAL_TABLET | Freq: Two times a day (BID) | ORAL | 3 refills | Status: DC

## 2020-10-06 NOTE — Patient Instructions (Addendum)
It was good to see you again today- take care and I will be in touch with your labs asap  We will change you over to levothyroxine once I get your TSH in I suspect we can decrease your diabetes medication We will set you up to see GI for your colon cancer screening and your chronic nausea    Health Maintenance, Female Adopting a healthy lifestyle and getting preventive care are important in promoting health and wellness. Ask your health care provider about:  The right schedule for you to have regular tests and exams.  Things you can do on your own to prevent diseases and keep yourself healthy. What should I know about diet, weight, and exercise? Eat a healthy diet  Eat a diet that includes plenty of vegetables, fruits, low-fat dairy products, and lean protein.  Do not eat a lot of foods that are high in solid fats, added sugars, or sodium.   Maintain a healthy weight Body mass index (BMI) is used to identify weight problems. It estimates body fat based on height and weight. Your health care provider can help determine your BMI and help you achieve or maintain a healthy weight. Get regular exercise Get regular exercise. This is one of the most important things you can do for your health. Most adults should:  Exercise for at least 150 minutes each week. The exercise should increase your heart rate and make you sweat (moderate-intensity exercise).  Do strengthening exercises at least twice a week. This is in addition to the moderate-intensity exercise.  Spend less time sitting. Even light physical activity can be beneficial. Watch cholesterol and blood lipids Have your blood tested for lipids and cholesterol at 50 years of age, then have this test every 5 years. Have your cholesterol levels checked more often if:  Your lipid or cholesterol levels are high.  You are older than 49 years of age.  You are at high risk for heart disease. What should I know about cancer  screening? Depending on your health history and family history, you may need to have cancer screening at various ages. This may include screening for:  Breast cancer.  Cervical cancer.  Colorectal cancer.  Skin cancer.  Lung cancer. What should I know about heart disease, diabetes, and high blood pressure? Blood pressure and heart disease  High blood pressure causes heart disease and increases the risk of stroke. This is more likely to develop in people who have high blood pressure readings, are of African descent, or are overweight.  Have your blood pressure checked: ? Every 3-5 years if you are 34-58 years of age. ? Every year if you are 35 years old or older. Diabetes Have regular diabetes screenings. This checks your fasting blood sugar level. Have the screening done:  Once every three years after age 30 if you are at a normal weight and have a low risk for diabetes.  More often and at a younger age if you are overweight or have a high risk for diabetes. What should I know about preventing infection? Hepatitis B If you have a higher risk for hepatitis B, you should be screened for this virus. Talk with your health care provider to find out if you are at risk for hepatitis B infection. Hepatitis C Testing is recommended for:  Everyone born from [birth year] through 1965.  Anyone with known risk factors for hepatitis C. Sexually transmitted infections (STIs)  Get screened for STIs, including gonorrhea and chlamydia, if: ? You are  sexually active and are younger than 50 years of age. ? You are older than 50 years of age and your health care provider tells you that you are at risk for this type of infection. ? Your sexual activity has changed since you were last screened, and you are at increased risk for chlamydia or gonorrhea. Ask your health care provider if you are at risk.  Ask your health care provider about whether you are at high risk for HIV. Your health care provider may  recommend a prescription medicine to help prevent HIV infection. If you choose to take medicine to prevent HIV, you should first get tested for HIV. You should then be tested every 3 months for as long as you are taking the medicine. Pregnancy  If you are about to stop having your period (premenopausal) and you may become pregnant, seek counseling before you get pregnant.  Take 400 to 800 micrograms (mcg) of folic acid every day if you become pregnant.  Ask for birth control (contraception) if you want to prevent pregnancy. Osteoporosis and menopause Osteoporosis is a disease in which the bones lose minerals and strength with aging. This can result in bone fractures. If you are 79 years old or older, or if you are at risk for osteoporosis and fractures, ask your health care provider if you should:  Be screened for bone loss.  Take a calcium or vitamin D supplement to lower your risk of fractures.  Be given hormone replacement therapy (HRT) to treat symptoms of menopause. Follow these instructions at home: Lifestyle  Do not use any products that contain nicotine or tobacco, such as cigarettes, e-cigarettes, and chewing tobacco. If you need help quitting, ask your health care provider.  Do not use street drugs.  Do not share needles.  Ask your health care provider for help if you need support or information about quitting drugs. Alcohol use  Do not drink alcohol if: ? Your health care provider tells you not to drink. ? You are pregnant, may be pregnant, or are planning to become pregnant.  If you drink alcohol: ? Limit how much you use to 0-1 drink a day. ? Limit intake if you are breastfeeding.  Be aware of how much alcohol is in your drink. In the [place] one drink equals one 12 oz bottle of beer (355 mL), one 5 oz glass of wine (148 mL), or one 1 oz glass of hard liquor (44 mL). General instructions  Schedule regular health, dental, and eye exams.  Stay current with your  vaccines.  Tell your health care provider if: ? You often feel depressed. ? You have ever been abused or do not feel safe at home. Summary  Adopting a healthy lifestyle and getting preventive care are important in promoting health and wellness.  Follow your health care provider's instructions about healthy diet, exercising, and getting tested or screened for diseases.  Follow your health care provider's instructions on monitoring your cholesterol and blood pressure. This information is not intended to replace advice given to you by your health care provider. Make sure you discuss any questions you have with your health care provider. Document Revised: 05/15/2018 Document Reviewed: 05/15/2018 Elsevier Patient Education  2021 [employer].

## 2020-10-07 LAB — COMPREHENSIVE METABOLIC PANEL
ALT: 11 U/L (ref 0–35)
AST: 13 U/L (ref 0–37)
Albumin: 4.3 g/dL (ref 3.5–5.2)
Alkaline Phosphatase: 59 U/L (ref 39–117)
BUN: 9 mg/dL (ref 6–23)
CO2: 29 mEq/L (ref 19–32)
Calcium: 9.9 mg/dL (ref 8.4–10.5)
Chloride: 104 mEq/L (ref 96–112)
Creatinine, Ser: 0.58 mg/dL (ref 0.40–1.20)
GFR: 106.25 mL/min (ref >60.00)
Glucose, Bld: 88 mg/dL (ref 70–99)
Potassium: 4.6 mEq/L (ref 3.5–5.1)
Sodium: 141 mEq/L (ref 135–145)
Total Bilirubin: 0.4 mg/dL (ref 0.2–1.2)
Total Protein: 7 g/dL (ref 6.0–8.3)

## 2020-10-07 LAB — LIPID PANEL
Cholesterol: 153 mg/dL (ref 0–200)
HDL: 72.1 mg/dL (ref >39.00)
LDL Cholesterol: 67 mg/dL (ref 0–99)
NonHDL: 80.91
Total CHOL/HDL Ratio: 2
Triglycerides: 68 mg/dL (ref 0.0–149.0)
VLDL: 13.6 mg/dL (ref 0.0–40.0)

## 2020-10-07 LAB — MICROALBUMIN / CREATININE URINE RATIO
Creatinine,U: 14.1 mg/dL
Microalb Creat Ratio: 5 mg/g (ref 0.0–30.0)
Microalb, Ur: <0.7 mg/dL (ref 0.0–1.9)

## 2020-10-07 LAB — VITAMIN D 25 HYDROXY (VIT D DEFICIENCY, FRACTURES): VITD: 48.86 ng/mL (ref 30.00–100.00)

## 2020-10-07 LAB — CBC
HCT: 42.8 % (ref 36.0–46.0)
Hemoglobin: 14 g/dL (ref 12.0–15.0)
MCHC: 32.8 g/dL (ref 30.0–36.0)
MCV: 89.2 fl (ref 78.0–100.0)
Platelets: 298 10*3/uL (ref 150.0–400.0)
RBC: 4.8 Mil/uL (ref 3.87–5.11)
RDW: 13.9 % (ref 11.5–15.5)
WBC: 8.1 10*3/uL (ref 4.0–10.5)

## 2020-10-07 LAB — HEPATITIS C ANTIBODY
Hepatitis C Ab: NONREACTIVE
SIGNAL TO CUT-OFF: 0 (ref <1.00)

## 2020-10-07 LAB — HEMOGLOBIN A1C: Hgb A1c MFr Bld: 5.8 % (ref 4.6–6.5)

## 2020-10-07 LAB — VITAMIN B12: Vitamin B-12: >1550 pg/mL — ABNORMAL HIGH (ref 211–911)

## 2020-10-07 LAB — TSH: TSH: 0.78 u[IU]/mL (ref 0.35–4.50)

## 2020-10-08 ENCOUNTER — Encounter: Payer: Self-pay | Admitting: Family Medicine

## 2020-10-08 MED ORDER — LEVOTHYROXINE SODIUM 75 MCG PO TABS: 75.0000 ug | ORAL_TABLET | Freq: Every day | ORAL | 3 refills | Status: DC

## 2020-10-08 NOTE — Addendum Note (Signed)
Addended by: Lamar Blinks C on: 10/08/2020 09:31 AM   Modules accepted: Orders

## 2020-10-11 ENCOUNTER — Ambulatory Visit (HOSPITAL_BASED_OUTPATIENT_CLINIC_OR_DEPARTMENT_OTHER): Admission: RE | Admit: 2020-10-11 | Payer: 59 | Source: Ambulatory Visit

## 2020-10-11 ENCOUNTER — Other Ambulatory Visit: Payer: Self-pay

## 2020-12-03 ENCOUNTER — Other Ambulatory Visit: Payer: Self-pay | Admitting: Family Medicine

## 2020-12-03 ENCOUNTER — Encounter: Payer: Self-pay | Admitting: Family Medicine

## 2020-12-03 DIAGNOSIS — R252 Cramp and spasm: Secondary | ICD-10-CM

## 2020-12-03 DIAGNOSIS — S43402A Unspecified sprain of left shoulder joint, initial encounter: Secondary | ICD-10-CM

## 2020-12-29 ENCOUNTER — Other Ambulatory Visit: Payer: Self-pay

## 2020-12-29 MED ORDER — LAMOTRIGINE 150 MG PO TABS
150.0000 mg | ORAL_TABLET | Freq: Every day | ORAL | 0 refills | Status: DC
Start: 2020-12-29 — End: 2021-01-15

## 2020-12-31 ENCOUNTER — Telehealth: Payer: Self-pay | Admitting: Family Medicine

## 2020-12-31 NOTE — Telephone Encounter (Signed)
Patient wants to know when she supposed to come back for tsh

## 2020-12-31 NOTE — Telephone Encounter (Signed)
I have spoken to Dr. Lorelei Pont and pt is to follow up in 6 m for the lab f/u to check tsh. Lab visit scheduled and orders are already in. I have called pt and relayed the message and she stated understanding.

## 2021-01-14 ENCOUNTER — Encounter: Payer: Self-pay | Admitting: Family Medicine

## 2021-01-14 DIAGNOSIS — R252 Cramp and spasm: Secondary | ICD-10-CM

## 2021-01-14 DIAGNOSIS — F411 Generalized anxiety disorder: Secondary | ICD-10-CM

## 2021-01-14 DIAGNOSIS — S43402A Unspecified sprain of left shoulder joint, initial encounter: Secondary | ICD-10-CM

## 2021-01-14 DIAGNOSIS — E1165 Type 2 diabetes mellitus with hyperglycemia: Secondary | ICD-10-CM

## 2021-01-14 DIAGNOSIS — E785 Hyperlipidemia, unspecified: Secondary | ICD-10-CM

## 2021-01-14 DIAGNOSIS — E034 Atrophy of thyroid (acquired): Secondary | ICD-10-CM

## 2021-01-14 DIAGNOSIS — E119 Type 2 diabetes mellitus without complications: Secondary | ICD-10-CM

## 2021-01-14 MED ORDER — DAPAGLIFLOZIN PROPANEDIOL 10 MG PO TABS: ORAL_TABLET | ORAL | 3 refills | Status: DC

## 2021-01-14 MED ORDER — CYCLOBENZAPRINE HCL 10 MG PO TABS: ORAL_TABLET | ORAL | 1 refills | Status: DC

## 2021-01-14 MED ORDER — LEVOTHYROXINE SODIUM 75 MCG PO TABS: 75.0000 ug | ORAL_TABLET | Freq: Every day | ORAL | 3 refills | Status: DC

## 2021-01-14 MED ORDER — METFORMIN HCL 1000 MG PO TABS: 1000.0000 mg | ORAL_TABLET | Freq: Two times a day (BID) | ORAL | 3 refills | Status: DC

## 2021-01-14 MED ORDER — ROSUVASTATIN CALCIUM 20 MG PO TABS: 20.0000 mg | ORAL_TABLET | Freq: Every day | ORAL | 3 refills | Status: DC

## 2021-01-15 MED ORDER — LAMOTRIGINE 150 MG PO TABS: 150.0000 mg | ORAL_TABLET | Freq: Every day | ORAL | 2 refills | Status: DC

## 2021-01-15 NOTE — Addendum Note (Signed)
Addended by: Lamar Blinks C on: 01/15/2021 12:14 PM   Modules accepted: Orders

## 2021-01-18 ENCOUNTER — Telehealth: Payer: Self-pay

## 2021-01-18 NOTE — Telephone Encounter (Signed)
Prior authorization for Farxiga 10 mg initiated via cover my meds.   Determination states medication covered under plan.

## 2021-04-18 ENCOUNTER — Other Ambulatory Visit: Payer: 59

## 2021-04-19 ENCOUNTER — Other Ambulatory Visit: Payer: Self-pay

## 2021-04-19 ENCOUNTER — Other Ambulatory Visit (INDEPENDENT_AMBULATORY_CARE_PROVIDER_SITE_OTHER): Payer: BLUE CROSS/BLUE SHIELD

## 2021-04-19 DIAGNOSIS — E034 Atrophy of thyroid (acquired): Secondary | ICD-10-CM | POA: Diagnosis not present

## 2021-04-19 LAB — TSH: TSH: 0.11 u[IU]/mL — ABNORMAL LOW (ref 0.35–5.50)

## 2021-04-20 ENCOUNTER — Encounter: Payer: Self-pay | Admitting: Family Medicine

## 2021-04-20 DIAGNOSIS — E034 Atrophy of thyroid (acquired): Secondary | ICD-10-CM

## 2021-04-20 MED ORDER — LEVOTHYROXINE SODIUM 50 MCG PO TABS: 50.0000 ug | ORAL_TABLET | Freq: Every day | ORAL | 3 refills | Status: DC

## 2021-04-20 NOTE — Addendum Note (Signed)
Addended by: Lamar Blinks C on: 04/20/2021 03:13 PM   Modules accepted: Orders

## 2021-09-28 ENCOUNTER — Other Ambulatory Visit: Payer: Self-pay | Admitting: Family Medicine

## 2021-09-28 ENCOUNTER — Encounter: Payer: Self-pay | Admitting: Family Medicine

## 2021-09-28 DIAGNOSIS — S43402A Unspecified sprain of left shoulder joint, initial encounter: Secondary | ICD-10-CM

## 2021-09-28 DIAGNOSIS — R252 Cramp and spasm: Secondary | ICD-10-CM

## 2021-10-04 ENCOUNTER — Encounter: Payer: Self-pay | Admitting: Family Medicine

## 2021-10-13 NOTE — Progress Notes (Addendum)
Therapist, music at [facility] ?[address], [address] ?[place], [place] [address] ?336 [phone] ?Fax [phone] ? ?Date:  10/17/2021  ? ?Name:  Sherri Cross   DOB:  09-Nov-1970   MRN:  [id number] ? ?PCP:  Darreld Mclean, MD  ? ? ?Chief Complaint: Annual Exam (Concerns/ questions: none/Eye exam: August 2022/Shingles: None/Never had Colonoscopy /HIV screen due) ? ? ?History of Present Illness: ? ?Sherri Cross is a 51 y.o. very pleasant female patient who presents with the following: ? ?Patient seen today for physical exam ?Most recent visit with myself about 1 year ago- history of diabetes, chronic fatigue, hypothyroidism, insomnia/anxiety ?At her visit a year ago she had lost weight, A1c 5.8% ?She thinks less stress has helped her to lose weight  ? ?Never a smoker ?Married to [place], no children  ?They worked out some marital difficulty and she is in a much better place emotionally. ? ?History of complete hysterectomy for benign disease ?Colon cancer screening; no family history - she prefers cologuard  ?Mammogram- she is now living in [place] and would like to do her mammo there. She will let me know where she was to have this done   ?Eye exam done 8/22- recheck one year  ?Foot exam is due ?Update labs today ? ?She enjoys walking for exercise- does more during the warmer months ?No CP or SOB  ? ?She does note a family history of CAD on her fathers side.  She is interested in getting a coronary calcium score which I will order ? ?She tapered off her lamotrigine due to concerns about her memory- her anxiety is doing ok without meds for now. If she needs medication for anxiety at a later date we can try something else for her  ? ?Pt states she has gotten adderall in the past - but I have never rx this for her that I am aware.  She has used it intermittently-states she is it more when she has when she needs to get done.  She wonders if I could prescribe this for her in the future ?She was  last receiving this medication from [facility] patient I will need to get previous records which I requested ? ? ?Patient Active Problem List  ? Diagnosis Date Noted  ? Uncontrolled type 2 diabetes mellitus with hyperglycemia ([place]) 06/04/2019  ? Menorrhagia 04/14/2019  ? Chronic pelvic pain in female 04/14/2019  ? Anxiety hyperventilation 11/05/2016  ? Psychophysiological insomnia 11/05/2016  ? Chronic fatigue syndrome with fibromyalgia 08/10/2016  ? Insomnia 10/21/2015  ? Anxiety disorder, unspecified 10/21/2015  ? Hypothyroidism due to acquired atrophy of thyroid 10/21/2015  ? Panic attacks 10/21/2015  ? ? ?Past Medical History:  ?Diagnosis Date  ? Anemia   ? iron  ? Chronic fatigue syndrome with fibromyalgia 08/10/2016  ? Complication of anesthesia   ? Diabetes mellitus type 2, uncontrolled, without complications   ? type 2  ? Fibromyalgia   ? Hypothyroidism   ? Panic attacks 10/21/2015  ? PONV (postoperative nausea and vomiting)   ? Refusal of blood products   ? Thyroid disease   ? ? ?Past Surgical History:  ?Procedure Laterality Date  ? Enometrial    ? LAPAROSCOPIC VAGINAL HYSTERECTOMY WITH SALPINGECTOMY Bilateral 04/14/2019  ? Procedure: LAPAROSCOPIC ASSISTED VAGINAL HYSTERECTOMY WITH SALPINGO-OOPHORECTOMY;  Surgeon: Crawford Givens, MD;  Location: [place];  Service: Gynecology;  Laterality: Bilateral;  ? POLYPECTOMY    ? Uterus  ? ? ?  Social History  ? ?Tobacco Use  ? Smoking status: Never  ? Smokeless tobacco: Never  ?Vaping Use  ? Vaping Use: Never used  ?Substance Use Topics  ? Alcohol use: No  ? Drug use: No  ? ? ?Family History  ?Problem Relation Age of Onset  ? Hypertension Mother   ? Diabetes Father   ? ? ?Allergies  ?Allergen Reactions  ? Blood-Group Specific Substance   ?  Jehovah's witness. Refuses all blood products except albumin. Erasmo Downer, MD (04/14/19)  ? ? ?Medication list has been reviewed and updated. ? ?Current Outpatient Medications on File Prior to Visit   ?Medication Sig Dispense Refill  ? cyclobenzaprine (FLEXERIL) 10 MG tablet TAKE 1 TABLET TWICE A DAY AS NEEDED FOR MUSCLE SPASMS 60 tablet 0  ? dapagliflozin propanediol (FARXIGA) 10 MG TABS tablet TAKE 10 MG BY MOUTH DAILY 90 tablet 3  ? lamoTRIgine (LAMICTAL) 150 MG tablet Take 1 tablet (150 mg total) by mouth daily. 90 tablet 2  ? levothyroxine (SYNTHROID) 50 MCG tablet Take 1 tablet (50 mcg total) by mouth daily. 90 tablet 3  ? metFORMIN (GLUCOPHAGE) 1000 MG tablet Take 1 tablet (1,000 mg total) by mouth 2 (two) times daily with a meal. 180 tablet 3  ? rosuvastatin (CRESTOR) 20 MG tablet Take 1 tablet (20 mg total) by mouth daily. 90 tablet 3  ? triamcinolone ointment (KENALOG) 0.1 % Apply 1 application topically 2 (two) times daily. 30 g 0  ? ?No current facility-administered medications on file prior to visit.  ? ? ?Review of Systems: ? ?As per HPI- otherwise negative. ? ? ?Physical Examination: ?Vitals:  ? 10/17/21 1303  ?BP: 110/60  ?Pulse: 82  ?Resp: 18  ?Temp: 97.9 ?F (36.6 ?C)  ?SpO2: 99%  ? ?Vitals:  ? 10/17/21 1303  ?Weight: 124 lb 6.4 oz (56.4 kg)  ?Height: '5\' 2"'$  (1.575 m)  ? ?Body mass index is 22.75 kg/m?. ?Ideal Body Weight: Weight in (lb) to have BMI = 25: 136.4 ? ?GEN: no acute distress.  Normal weight, looks well ?HEENT: Atraumatic, Normocephalic.  Bilateral TM wnl, oropharynx normal.  PEERL,EOMI.   ?Ears and Nose: No external deformity. ?CV: RRR, No M/G/R. No JVD. No thrill. No extra heart sounds. ?PULM: CTA B, no wheezes, crackles, rhonchi. No retractions. No resp. distress. No accessory muscle use. ?ABD: S, NT, ND, +BS. No rebound. No HSM. ?EXTR: No c/c/e ?PSYCH: Normally interactive. Conversant.  ?Foot exam normal ? ?Assessment and Plan: ?Physical exam ? ?Hypothyroidism due to acquired atrophy of thyroid - Plan: TSH ? ?Controlled type 2 diabetes mellitus without complication, without long-term current use of insulin (HCC) - Plan: Comprehensive metabolic panel, Hemoglobin A1c, Microalbumin /  creatinine urine ratio, metFORMIN (GLUCOPHAGE) 1000 MG tablet ? ?Dyslipidemia - Plan: Lipid panel, rosuvastatin (CRESTOR) 20 MG tablet ? ?Screening for deficiency anemia - Plan: CBC ? ?Screening for colon cancer - Plan: Cologuard ? ?Family history of early CAD - Plan: CT CARDIAC SCORING (SELF PAY ONLY) ? ?Immunization due - Plan: Varicella-zoster vaccine IM (Shingrix) ? ?Uncontrolled type 2 diabetes mellitus with hyperglycemia ([place]) - Plan: dapagliflozin propanediol (FARXIGA) 10 MG TABS tablet ? ?Leg cramps - Plan: cyclobenzaprine (FLEXERIL) 5 MG tablet ? ?Sprain of left shoulder, unspecified shoulder sprain type, initial encounter - Plan: cyclobenzaprine (FLEXERIL) 5 MG tablet ? ?Patient seen today for physical exam.  Encouraged healthy diet and exercise routine ?Follow-up on diabetes.  Her sugars have been under very good control with recent weight loss.  May be able  to decrease dose of metformin and/or stop [place] ?Refill Crestor ?Order Cologuard ?I will order her mammogram when she lets me know where she wishes to have it done-gave first dose of Shingrix today ?She does take Flexeril 5 mg twice daily on a regular basis for back pain and other muscle spasms.  Refilled this for her today ? ?Signed ?Lamar Blinks, MD ? ?Addendum 5/16, received her labs as below.  Message to patient ? ?Results for orders placed or performed in visit on 10/17/21  ?CBC  ?Result Value Ref Range  ? WBC 5.9 4.0 - 10.5 K/uL  ? RBC 4.50 3.87 - 5.11 Mil/uL  ? Platelets 223.0 150.0 - 400.0 K/uL  ? Hemoglobin 13.3 12.0 - 15.0 g/dL  ? HCT 40.6 36.0 - 46.0 %  ? MCV 90.1 78.0 - 100.0 fl  ? MCHC 32.9 30.0 - 36.0 g/dL  ? RDW 12.7 11.5 - 15.5 %  ?Comprehensive metabolic panel  ?Result Value Ref Range  ? Sodium 139 135 - 145 mEq/L  ? Potassium 4.0 3.5 - 5.1 mEq/L  ? Chloride 103 96 - 112 mEq/L  ? CO2 27 19 - 32 mEq/L  ? Glucose, Bld 81 70 - 99 mg/dL  ? BUN 11 6 - 23 mg/dL  ? Creatinine, Ser 0.48 0.40 - 1.20 mg/dL  ? Total Bilirubin 0.5 0.2 - 1.2  mg/dL  ? Alkaline Phosphatase 49 39 - 117 U/L  ? AST 15 0 - 37 U/L  ? ALT 14 0 - 35 U/L  ? Total Protein 6.4 6.0 - 8.3 g/dL  ? Albumin 4.3 3.5 - 5.2 g/dL  ? GFR 110.41 >60.00 mL/min  ? Calcium 9.3 8.4 - 10.5 mg/

## 2021-10-13 NOTE — Patient Instructions (Addendum)
It was good to see you again today, I will be in touch with your labs as soon as possible ?Please let me know where you would like to do your mammogram ?I ordered your coronary calcium score- stop by imaging on your way out and schedule or possibly do this today!  ? ?Cologuard kit will come to your home  ?First dose of shingrix today- we can do the 2nd dose as a nurse visit only at your convenience  ?

## 2021-10-17 ENCOUNTER — Encounter: Payer: BLUE CROSS/BLUE SHIELD | Admitting: Family Medicine

## 2021-10-17 ENCOUNTER — Ambulatory Visit (INDEPENDENT_AMBULATORY_CARE_PROVIDER_SITE_OTHER): Payer: BLUE CROSS/BLUE SHIELD | Admitting: Family Medicine

## 2021-10-17 VITALS — BP 110/60 | HR 82 | Temp 97.9°F | Resp 18 | Ht 62.0 in | Wt 124.4 lb

## 2021-10-17 DIAGNOSIS — E785 Hyperlipidemia, unspecified: Secondary | ICD-10-CM

## 2021-10-17 DIAGNOSIS — Z0000 Encounter for general adult medical examination without abnormal findings: Secondary | ICD-10-CM

## 2021-10-17 DIAGNOSIS — R252 Cramp and spasm: Secondary | ICD-10-CM

## 2021-10-17 DIAGNOSIS — E119 Type 2 diabetes mellitus without complications: Secondary | ICD-10-CM

## 2021-10-17 DIAGNOSIS — E1165 Type 2 diabetes mellitus with hyperglycemia: Secondary | ICD-10-CM

## 2021-10-17 DIAGNOSIS — Z23 Encounter for immunization: Secondary | ICD-10-CM | POA: Diagnosis not present

## 2021-10-17 DIAGNOSIS — Z130 Encounter for screening for diseases of the blood and blood-forming organs and certain disorders involving the immune mechanism: Secondary | ICD-10-CM | POA: Diagnosis not present

## 2021-10-17 DIAGNOSIS — E034 Atrophy of thyroid (acquired): Secondary | ICD-10-CM

## 2021-10-17 DIAGNOSIS — S43402A Unspecified sprain of left shoulder joint, initial encounter: Secondary | ICD-10-CM

## 2021-10-17 DIAGNOSIS — Z8249 Family history of ischemic heart disease and other diseases of the circulatory system: Secondary | ICD-10-CM

## 2021-10-17 DIAGNOSIS — Z1211 Encounter for screening for malignant neoplasm of colon: Secondary | ICD-10-CM

## 2021-10-17 LAB — CBC
HCT: 40.6 % (ref 36.0–46.0)
Hemoglobin: 13.3 g/dL (ref 12.0–15.0)
MCHC: 32.9 g/dL (ref 30.0–36.0)
MCV: 90.1 fl (ref 78.0–100.0)
Platelets: 223 10*3/uL (ref 150.0–400.0)
RBC: 4.5 Mil/uL (ref 3.87–5.11)
RDW: 12.7 % (ref 11.5–15.5)
WBC: 5.9 10*3/uL (ref 4.0–10.5)

## 2021-10-17 LAB — HEMOGLOBIN A1C: Hgb A1c MFr Bld: 5.9 % (ref 4.6–6.5)

## 2021-10-17 LAB — TSH: TSH: 0.28 u[IU]/mL — ABNORMAL LOW (ref 0.35–5.50)

## 2021-10-17 MED ORDER — DAPAGLIFLOZIN PROPANEDIOL 10 MG PO TABS: ORAL_TABLET | ORAL | 3 refills | Status: DC

## 2021-10-17 MED ORDER — METFORMIN HCL 1000 MG PO TABS: 1000.0000 mg | ORAL_TABLET | Freq: Two times a day (BID) | ORAL | 3 refills | Status: DC

## 2021-10-17 MED ORDER — CYCLOBENZAPRINE HCL 5 MG PO TABS: ORAL_TABLET | ORAL | 1 refills | Status: DC

## 2021-10-17 MED ORDER — ROSUVASTATIN CALCIUM 20 MG PO TABS: 20.0000 mg | ORAL_TABLET | Freq: Every day | ORAL | 3 refills | Status: DC

## 2021-10-18 ENCOUNTER — Encounter: Payer: Self-pay | Admitting: Family Medicine

## 2021-10-18 DIAGNOSIS — E1165 Type 2 diabetes mellitus with hyperglycemia: Secondary | ICD-10-CM

## 2021-10-18 LAB — COMPREHENSIVE METABOLIC PANEL
ALT: 14 U/L (ref 0–35)
AST: 15 U/L (ref 0–37)
Albumin: 4.3 g/dL (ref 3.5–5.2)
Alkaline Phosphatase: 49 U/L (ref 39–117)
BUN: 11 mg/dL (ref 6–23)
CO2: 27 mEq/L (ref 19–32)
Calcium: 9.3 mg/dL (ref 8.4–10.5)
Chloride: 103 mEq/L (ref 96–112)
Creatinine, Ser: 0.48 mg/dL (ref 0.40–1.20)
GFR: 110.41 mL/min (ref >60.00)
Glucose, Bld: 81 mg/dL (ref 70–99)
Potassium: 4 mEq/L (ref 3.5–5.1)
Sodium: 139 mEq/L (ref 135–145)
Total Bilirubin: 0.5 mg/dL (ref 0.2–1.2)
Total Protein: 6.4 g/dL (ref 6.0–8.3)

## 2021-10-18 LAB — LIPID PANEL
Cholesterol: 132 mg/dL (ref 0–200)
HDL: 71.3 mg/dL (ref >39.00)
LDL Cholesterol: 51 mg/dL (ref 0–99)
NonHDL: 60.39
Total CHOL/HDL Ratio: 2
Triglycerides: 47 mg/dL (ref 0.0–149.0)
VLDL: 9.4 mg/dL (ref 0.0–40.0)

## 2021-10-18 LAB — MICROALBUMIN / CREATININE URINE RATIO
Creatinine,U: 16.6 mg/dL
Microalb Creat Ratio: 4.2 mg/g (ref 0.0–30.0)
Microalb, Ur: <0.7 mg/dL (ref 0.0–1.9)

## 2021-10-18 MED ORDER — LEVOTHYROXINE SODIUM 25 MCG PO TABS: 25.0000 ug | ORAL_TABLET | Freq: Every day | ORAL | 3 refills | Status: DC

## 2021-10-18 MED ORDER — DAPAGLIFLOZIN PROPANEDIOL 5 MG PO TABS: ORAL_TABLET | ORAL | 3 refills | Status: DC

## 2021-10-18 NOTE — Addendum Note (Signed)
Addended by: Lamar Blinks C on: 10/18/2021 01:19 PM ? ? Modules accepted: Orders ? ?

## 2021-10-19 MED ORDER — DAPAGLIFLOZIN PROPANEDIOL 5 MG PO TABS: ORAL_TABLET | ORAL | 3 refills | Status: DC

## 2021-10-19 NOTE — Addendum Note (Signed)
Addended by: Lamar Blinks C on: 10/19/2021 05:48 AM ? ? Modules accepted: Orders ? ?

## 2021-10-22 DIAGNOSIS — Z1211 Encounter for screening for malignant neoplasm of colon: Secondary | ICD-10-CM | POA: Diagnosis not present

## 2021-11-01 LAB — COLOGUARD: COLOGUARD: NEGATIVE

## 2021-11-02 ENCOUNTER — Encounter: Payer: Self-pay | Admitting: Family Medicine

## 2022-01-16 ENCOUNTER — Encounter: Payer: Self-pay | Admitting: Family Medicine

## 2022-01-16 ENCOUNTER — Other Ambulatory Visit (INDEPENDENT_AMBULATORY_CARE_PROVIDER_SITE_OTHER): Payer: BLUE CROSS/BLUE SHIELD

## 2022-01-16 DIAGNOSIS — E034 Atrophy of thyroid (acquired): Secondary | ICD-10-CM

## 2022-01-16 LAB — TSH: TSH: 0.9 u[IU]/mL (ref 0.35–5.50)

## 2022-01-19 ENCOUNTER — Other Ambulatory Visit: Payer: Self-pay

## 2022-01-20 ENCOUNTER — Other Ambulatory Visit: Payer: Self-pay

## 2022-01-20 DIAGNOSIS — E034 Atrophy of thyroid (acquired): Secondary | ICD-10-CM

## 2022-01-20 MED ORDER — LEVOTHYROXINE SODIUM 25 MCG PO TABS: 25.0000 ug | ORAL_TABLET | Freq: Every day | ORAL | 3 refills | Status: DC

## 2022-01-29 NOTE — Progress Notes (Unsigned)
[facility] [address], [place], [place] [address] [phone]  Date:  01/30/2022   Name:  Sherri Cross   DOB:  [birth year]   MRN:  [id number]  PCP:  Darreld Mclean, MD    Chief Complaint: No chief complaint on file.   History of Present Illness:  Sherri Cross is a 51 y.o. very pleasant female patient who presents with the following:  Virtual visit today to discuss concerns about memory Most recent visit with myself was in May of this year:  history of diabetes, chronic fatigue, hypothyroidism, insomnia/anxiety At her visit a year ago she had lost weight, A1c 5.8% She thinks less stress has helped her to lose weight   Patient location is home, my location is office.  Patient identity confirmed with 2 factors, she gives consent for virtual visit today.  The patient and myself are present on the call today  Pt notes "my head is not working right," she feels very forgetful recently  She notes that her thoughts are "disorganized" and she is having a hard time getting her work done  She has noted this for about a year - perhaps 18 months She thinks she may be noticing it more since she is more active / pandemic is over No headaches She may have a hard time understanding what she is reading, or forgets names, or may mispronounce words that she knows  Recent TSH and A1c looked good  B12 has been low in the past but was high at last level 5/22 Lab Results  Component Value Date   TSH 0.90 01/16/2022    Lab Results  Component Value Date   HGBA1C 5.9 10/17/2021     Patient Active Problem List   Diagnosis Date Noted   Uncontrolled type 2 diabetes mellitus with hyperglycemia ([place]) 06/04/2019   Menorrhagia 04/14/2019   Chronic pelvic pain in female 04/14/2019   Anxiety hyperventilation 11/05/2016   Psychophysiological insomnia 11/05/2016   Chronic fatigue syndrome with fibromyalgia 08/10/2016    Insomnia 10/21/2015   Anxiety disorder, unspecified 10/21/2015   Hypothyroidism due to acquired atrophy of thyroid 10/21/2015   Panic attacks 10/21/2015    Past Medical History:  Diagnosis Date   Anemia    iron   Chronic fatigue syndrome with fibromyalgia 3/0/1601   Complication of anesthesia    Diabetes mellitus type 2, uncontrolled, without complications    type 2   Fibromyalgia    Hypothyroidism    Panic attacks 10/21/2015   PONV (postoperative nausea and vomiting)    Refusal of blood products    Thyroid disease     Past Surgical History:  Procedure Laterality Date   Enometrial     LAPAROSCOPIC VAGINAL HYSTERECTOMY WITH SALPINGECTOMY Bilateral 04/14/2019   Procedure: LAPAROSCOPIC ASSISTED VAGINAL HYSTERECTOMY WITH SALPINGO-OOPHORECTOMY;  Surgeon: Crawford Givens, MD;  Location: [facility];  Service: Gynecology;  Laterality: Bilateral;   POLYPECTOMY     Uterus    Social History   Tobacco Use   Smoking status: Never   Smokeless tobacco: Never  Vaping Use   Vaping Use: Never used  Substance Use Topics   Alcohol use: No   Drug use: No    Family History  Problem Relation Age of Onset   Hypertension Mother    Diabetes Father     Allergies  Allergen Reactions   Blood-Group Specific Substance     Jehovah's witness. Refuses all blood  products except albumin. Erasmo Downer, MD (04/14/19)    Medication list has been reviewed and updated.  Current Outpatient Medications on File Prior to Visit  Medication Sig Dispense Refill   cyclobenzaprine (FLEXERIL) 5 MG tablet Take 5 mg twice a day as needed for muscle spasm 180 tablet 1   dapagliflozin propanediol (FARXIGA) 5 MG TABS tablet TAKE '5MG'$  BY MOUTH DAILY 90 tablet 3   levothyroxine (SYNTHROID) 25 MCG tablet Take 1 tablet (25 mcg total) by mouth daily. 90 tablet 3   metFORMIN (GLUCOPHAGE) 1000 MG tablet Take 1 tablet (1,000 mg total) by mouth 2 (two) times daily with a meal. 180 tablet 3   rosuvastatin  (CRESTOR) 20 MG tablet Take 1 tablet (20 mg total) by mouth daily. 90 tablet 3   triamcinolone ointment (KENALOG) 0.1 % Apply 1 application topically 2 (two) times daily. 30 g 0   No current facility-administered medications on file prior to visit.    Review of Systems:  As per HPI- otherwise negative.   Physical Examination: There were no vitals filed for this visit. There were no vitals filed for this visit. There is no height or weight on file to calculate BMI. Ideal Body Weight:    Pt observed via mychart video   Assessment and Plan: Memory changes - Plan: Ambulatory referral to Neurology Virtual visit today to discuss concern of memory loss.  Patient has noticed concerns about her memory over the last 12 to 18 months.  Most recent lab work has looked okay.  She does have history of depression but feels that this is currently under good control.  She also notes she is sleeping well, about 9 hours a night.  She uses clonazepam just occasionally now  Referral made to neurology for more detailed assessment of her memory  Signed Lamar Blinks, MD

## 2022-01-30 ENCOUNTER — Telehealth (INDEPENDENT_AMBULATORY_CARE_PROVIDER_SITE_OTHER): Payer: BLUE CROSS/BLUE SHIELD | Admitting: Family Medicine

## 2022-01-30 DIAGNOSIS — R413 Other amnesia: Secondary | ICD-10-CM

## 2022-02-24 DIAGNOSIS — E119 Type 2 diabetes mellitus without complications: Secondary | ICD-10-CM | POA: Diagnosis not present

## 2022-02-24 LAB — HM DIABETES EYE EXAM

## 2022-04-29 ENCOUNTER — Other Ambulatory Visit: Payer: Self-pay | Admitting: Family Medicine

## 2022-04-29 DIAGNOSIS — S43402A Unspecified sprain of left shoulder joint, initial encounter: Secondary | ICD-10-CM

## 2022-04-29 DIAGNOSIS — R252 Cramp and spasm: Secondary | ICD-10-CM

## 2022-07-25 DIAGNOSIS — Z1339 Encounter for screening examination for other mental health and behavioral disorders: Secondary | ICD-10-CM | POA: Diagnosis not present

## 2022-07-25 DIAGNOSIS — G3184 Mild cognitive impairment, so stated: Secondary | ICD-10-CM | POA: Diagnosis not present

## 2022-07-31 ENCOUNTER — Encounter: Payer: Self-pay | Admitting: *Deleted

## 2022-07-31 ENCOUNTER — Other Ambulatory Visit: Payer: Self-pay | Admitting: *Deleted

## 2022-07-31 DIAGNOSIS — E119 Type 2 diabetes mellitus without complications: Secondary | ICD-10-CM

## 2022-07-31 MED ORDER — METFORMIN HCL 1000 MG PO TABS: 1000.0000 mg | ORAL_TABLET | Freq: Two times a day (BID) | ORAL | 0 refills | Status: DC

## 2022-08-11 DIAGNOSIS — G3184 Mild cognitive impairment, so stated: Secondary | ICD-10-CM | POA: Diagnosis not present

## 2022-08-25 ENCOUNTER — Encounter: Payer: Self-pay | Admitting: Family Medicine

## 2022-08-25 DIAGNOSIS — E034 Atrophy of thyroid (acquired): Secondary | ICD-10-CM

## 2022-08-25 DIAGNOSIS — G3184 Mild cognitive impairment, so stated: Secondary | ICD-10-CM | POA: Diagnosis not present

## 2022-08-26 MED ORDER — THYROID 15 MG PO TABS: 15.0000 mg | ORAL_TABLET | Freq: Every day | ORAL | 1 refills | Status: DC

## 2022-08-26 NOTE — Addendum Note (Signed)
Addended by: Lamar Blinks C on: 08/26/2022 07:50 AM   Modules accepted: Orders

## 2022-08-29 MED ORDER — THYROID 15 MG PO TABS: 15.0000 mg | ORAL_TABLET | Freq: Every day | ORAL | 1 refills | Status: DC

## 2022-08-29 NOTE — Addendum Note (Signed)
Addended by: Darreld Mclean on: 08/29/2022 04:34 PM   Modules accepted: Orders

## 2022-10-16 ENCOUNTER — Other Ambulatory Visit: Payer: Self-pay | Admitting: Family Medicine

## 2022-10-16 DIAGNOSIS — E1165 Type 2 diabetes mellitus with hyperglycemia: Secondary | ICD-10-CM

## 2022-10-27 ENCOUNTER — Other Ambulatory Visit: Payer: Self-pay | Admitting: Family Medicine

## 2022-10-27 DIAGNOSIS — E119 Type 2 diabetes mellitus without complications: Secondary | ICD-10-CM

## 2022-11-01 ENCOUNTER — Encounter: Payer: BLUE CROSS/BLUE SHIELD | Admitting: Family Medicine

## 2022-11-05 NOTE — Progress Notes (Unsigned)
[facility] at [facility] [address], [address] [place], [place] [address] [phone]  Date:  11/08/2022  Name:  Sherri Cross   DOB:  [birth year]   MRN:  [id number]  PCP:  Pearline Cables, MD    Chief Complaint: Annual Exam (Concerns/ questions: pt has had a MRI and labs for her memory issues/Mammogram: none recent/HIV screen due/Shingrix dose #2: okay with completing today)   History of Present Illness:  Sherri Cross is a 52 y.o. very pleasant female patient who presents with the following:  Pt seen today for a CPE- history of diabetes, chronic fatigue, hypothyroidism, insomnia/anxiety  Last seen by myself virtually in August- a virtual visit.  At that time she was concerned about her memory and I made a referral to neurology for her.  She was diagnosed with MCI.  Looking back pt notes she had difficulty in school esp in testing situations, she believes her symptoms most closely fit with ADD.  She notes now she is having a hard time getting things done, staying organized, or attending to information that is being given to her.  She has to read things several times, completing tasks takes much longer than it used to  She did use adderall prn in the past- she has some leftover but the rx is quite old.  Her prescription is for 10 mg.  She notes this does help her somewhat, may have lost some potency over the last few years  She is not feeling depressed Her husband does note snoring but not necessarily apnea.  She would like to be screened for sleep apnea which we can certainly do She notes frequent achiness of her bilateral calves.  Not necessarily claudication, more rest pain and cramping.  From neurology visit in February: A/P -- Ms.Ladwig is a 52 y.o. female seen today regarding an approximately two year history of cognitive concerns, particularly with short term memory, attention, and language. She is able to complete basic ADL's on  her own. Her neurologic exam today was non-focal with no evidence of bradykinesia. Her cognitive testing through [facility] was 23/30, consistent with a mild cognitive impairment. We discussed that this can come from multiple etiologies including history of depression, mood disorder, or ADHD, other chronic health conditions that can worsen vascular health, poor sleep, metabolic etiologies. We will assess for any structural CNS abnormalities with MRI brain without contrast. Will check for metabolic etiologies with blood work today. I have referred her to psychiatry for ADHD evaluation and recommendation for therapeutic management. She was encouraged to stay socially and physically active (goal of 30 minutes five times per week). She will follow up in 6 months to assess her progress.  Diagnoses and all orders for this visit: Mild cognitive impairment - Thyroid Stimulating Hormone (TSH); Future - Vitamin B12; Future - MRI brain without contrast; Future - 25 OH Vitamin D; Future - Ambulatory Referral to Adult Behavioral Health   She did a brain MRI in March- normal  Mammogram-patient actually lives in [place], she will contact her preferred imaging facility and schedule Shingirx 2nd dose- will give today  Can update labs Urine micro due- update today  Foot exam due- update today  Pap- s/p hysterectomy- total in 2020 Lab Results  Component Value Date   HGBA1C 5.9 10/17/2021   Farxiga Metformin Crestor Armour thryoid   Patient Active Problem List   Diagnosis Date Noted   Uncontrolled type 2 diabetes mellitus with  hyperglycemia (HCC) 06/04/2019   Menorrhagia 04/14/2019   Chronic pelvic pain in female 04/14/2019   Anxiety hyperventilation 11/05/2016   Psychophysiological insomnia 11/05/2016   Chronic fatigue syndrome with fibromyalgia 08/10/2016   Insomnia 10/21/2015   Anxiety disorder, unspecified 10/21/2015   Hypothyroidism due to acquired atrophy of thyroid 10/21/2015   Panic attacks  10/21/2015    Past Medical History:  Diagnosis Date   Anemia    iron   Chronic fatigue syndrome with fibromyalgia 08/10/2016   Complication of anesthesia    Diabetes mellitus type 2, uncontrolled, without complications    type 2   Fibromyalgia    Hypothyroidism    Panic attacks 10/21/2015   PONV (postoperative nausea and vomiting)    Refusal of blood products    Thyroid disease     Past Surgical History:  Procedure Laterality Date   Enometrial     LAPAROSCOPIC VAGINAL HYSTERECTOMY WITH SALPINGECTOMY Bilateral 04/14/2019   Procedure: LAPAROSCOPIC ASSISTED VAGINAL HYSTERECTOMY WITH SALPINGO-OOPHORECTOMY;  Surgeon: Jaymes Graff, MD;  Location: [facility];  Service: Gynecology;  Laterality: Bilateral;   POLYPECTOMY     Uterus    Social History   Tobacco Use   Smoking status: Never   Smokeless tobacco: Never  Vaping Use   Vaping Use: Never used  Substance Use Topics   Alcohol use: No   Drug use: No    Family History  Problem Relation Age of Onset   Hypertension Mother    Diabetes Father     Allergies  Allergen Reactions   Blood-Group Specific Substance     Jehovah's witness. Refuses all blood products except albumin. Dereck Ligas, MD (04/14/19)    Medication list has been reviewed and updated.  Current Outpatient Medications on File Prior to Visit  Medication Sig Dispense Refill   cyclobenzaprine (FLEXERIL) 5 MG tablet TAKE 1 TABLET TWICE A DAY AS NEEDED FOR MUSCLE SPASM 180 tablet 3   FARXIGA 5 MG TABS tablet TAKE 1 TABLET DAILY 90 tablet 3   metFORMIN (GLUCOPHAGE) 1000 MG tablet Take 1 tablet (1,000 mg total) by mouth 2 (two) times daily with a meal. 180 tablet 0   rosuvastatin (CRESTOR) 20 MG tablet Take 1 tablet (20 mg total) by mouth daily. 90 tablet 3   thyroid (ARMOUR THYROID) 15 MG tablet Take 1 tablet (15 mg total) by mouth daily. 30 tablet 1   triamcinolone ointment (KENALOG) 0.1 % Apply 1 application topically 2 (two) times daily. 30 g 0    No current facility-administered medications on file prior to visit.    Review of Systems:  As per HPI- otherwise negative.   Physical Examination: Vitals:   11/08/22 1123  BP: 122/60  Pulse: 85  Resp: 18  Temp: 97.8 F (36.6 C)  SpO2: 98%   Vitals:   11/08/22 1123  Weight: 117 lb 12.8 oz (53.4 kg)  Height: 5\' 2"  (1.575 m)   Body mass index is 21.55 kg/m. Ideal Body Weight: Weight in (lb) to have BMI = 25: 136.4  GEN: no acute distress.  Normal weight, looks well  HEENT: Atraumatic, Normocephalic.  Ears and Nose: No external deformity. CV: RRR, No M/G/R. No JVD. No thrill. No extra heart sounds. PULM: CTA B, no wheezes, crackles, rhonchi. No retractions. No resp. distress. No accessory muscle use. ABD: S, NT, ND, +BS. No rebound. No HSM. EXTR: No c/c/e PSYCH: Normally interactive. Conversant.  Foot exam: Normal monofilament, reduced pulses bilaterally especially left  Assessment and Plan: Physical exam  Hypothyroidism due to acquired atrophy of thyroid - Plan: TSH  Uncontrolled type 2 diabetes mellitus with hyperglycemia (HCC) - Plan: Comprehensive metabolic panel, Hemoglobin A1c, Microalbumin / creatinine urine ratio  Dyslipidemia - Plan: Lipid panel  Screening for deficiency anemia - Plan: CBC  Fatigue, unspecified type - Plan: VITAMIN D 25 Hydroxy (Vit-D Deficiency, Fractures), Ambulatory referral to Sleep Studies  Attention deficit disorder (ADD) without hyperactivity - Plan: amphetamine-dextroamphetamine (ADDERALL) 10 MG tablet  Snoring - Plan: Ambulatory referral to Sleep Studies, CANCELED: Ambulatory referral to Neurology  Change in nail appearance - Plan: Ferritin  Pain in both lower legs - Plan: VAS [place] ABI WITH/WO TBI  Need for shingles vaccine - Plan: Zoster Recombinant (Shingrix )  Physical exam today- encouraged healthy diet and exercise routine  Lab work pending as above to monitor hypothyroidism, dyslipidemia, well-controlled  diabetes Referral made for sleep study Refilled Adderall which she is using for likely ADHD combined with mild cognitive impairment. Referral for ABI testing to rule out circulatory disorder of legs Second dose Shingrix She has noted some increased ridging appearance of her nails, check iron Signed Abbe Amsterdam, MD  Addnd 6/6- received labs as below  Results for orders placed or performed in visit on 11/08/22  CBC  Result Value Ref Range   WBC 6.0 4.0 - 10.5 K/uL   RBC 4.74 3.87 - 5.11 Mil/uL   Platelets 248.0 150.0 - 400.0 K/uL   Hemoglobin 14.0 12.0 - 15.0 g/dL   HCT 16.1 09.6 - 04.5 %   MCV 92.4 78.0 - 100.0 fl   MCHC 32.0 30.0 - 36.0 g/dL   RDW 40.9 81.1 - 91.4 %  Comprehensive metabolic panel  Result Value Ref Range   Sodium 139 135 - 145 mEq/L   Potassium 4.6 3.5 - 5.1 mEq/L   Chloride 101 96 - 112 mEq/L   CO2 25 19 - 32 mEq/L   Glucose, Bld 88 70 - 99 mg/dL   BUN 9 6 - 23 mg/dL   Creatinine, Ser 7.82 0.40 - 1.20 mg/dL   Total Bilirubin 0.4 0.2 - 1.2 mg/dL   Alkaline Phosphatase 50 39 - 117 U/L   AST 19 0 - 37 U/L   ALT 17 0 - 35 U/L   Total Protein 6.8 6.0 - 8.3 g/dL   Albumin 4.1 3.5 - 5.2 g/dL   GFR 956.21 >30.86 mL/min   Calcium 9.3 8.4 - 10.5 mg/dL  Hemoglobin V7Q  Result Value Ref Range   Hgb A1c MFr Bld 5.9 4.6 - 6.5 %  Lipid panel  Result Value Ref Range   Cholesterol 152 0 - 200 mg/dL   Triglycerides 46.9 0.0 - 149.0 mg/dL   HDL 62.95 >28.41 mg/dL   VLDL 32.4 0.0 - 40.1 mg/dL   LDL Cholesterol 70 0 - 99 mg/dL   Total CHOL/HDL Ratio 2    NonHDL 81.20   TSH  Result Value Ref Range   TSH 1.60 0.35 - 5.50 uIU/mL  VITAMIN D 25 Hydroxy (Vit-D Deficiency, Fractures)  Result Value Ref Range   VITD 70.16 30.00 - 100.00 ng/mL  Microalbumin / creatinine urine ratio  Result Value Ref Range   Microalb, Ur 1.8 0.0 - 1.9 mg/dL   Creatinine,U 02.7 mg/dL   Microalb Creat Ratio 2.5 0.0 - 30.0 mg/g  Ferritin  Result Value Ref Range   Ferritin 10.6 10.0 -  291.0 ng/mL

## 2022-11-08 ENCOUNTER — Ambulatory Visit (INDEPENDENT_AMBULATORY_CARE_PROVIDER_SITE_OTHER): Payer: BLUE CROSS/BLUE SHIELD | Admitting: Family Medicine

## 2022-11-08 VITALS — BP 122/60 | HR 85 | Temp 97.8°F | Resp 18 | Ht 62.0 in | Wt 117.8 lb

## 2022-11-08 DIAGNOSIS — R5383 Other fatigue: Secondary | ICD-10-CM | POA: Diagnosis not present

## 2022-11-08 DIAGNOSIS — E785 Hyperlipidemia, unspecified: Secondary | ICD-10-CM

## 2022-11-08 DIAGNOSIS — L608 Other nail disorders: Secondary | ICD-10-CM | POA: Diagnosis not present

## 2022-11-08 DIAGNOSIS — Z7984 Long term (current) use of oral hypoglycemic drugs: Secondary | ICD-10-CM

## 2022-11-08 DIAGNOSIS — F988 Other specified behavioral and emotional disorders with onset usually occurring in childhood and adolescence: Secondary | ICD-10-CM

## 2022-11-08 DIAGNOSIS — E034 Atrophy of thyroid (acquired): Secondary | ICD-10-CM | POA: Diagnosis not present

## 2022-11-08 DIAGNOSIS — E1165 Type 2 diabetes mellitus with hyperglycemia: Secondary | ICD-10-CM | POA: Diagnosis not present

## 2022-11-08 DIAGNOSIS — Z0000 Encounter for general adult medical examination without abnormal findings: Secondary | ICD-10-CM

## 2022-11-08 DIAGNOSIS — Z23 Encounter for immunization: Secondary | ICD-10-CM | POA: Diagnosis not present

## 2022-11-08 DIAGNOSIS — Z130 Encounter for screening for diseases of the blood and blood-forming organs and certain disorders involving the immune mechanism: Secondary | ICD-10-CM

## 2022-11-08 DIAGNOSIS — R0683 Snoring: Secondary | ICD-10-CM

## 2022-11-08 DIAGNOSIS — M79662 Pain in left lower leg: Secondary | ICD-10-CM

## 2022-11-08 DIAGNOSIS — M79661 Pain in right lower leg: Secondary | ICD-10-CM

## 2022-11-08 LAB — CBC
HCT: 43.8 % (ref 36.0–46.0)
Hemoglobin: 14 g/dL (ref 12.0–15.0)
MCHC: 32 g/dL (ref 30.0–36.0)
MCV: 92.4 fl (ref 78.0–100.0)
Platelets: 248 10*3/uL (ref 150.0–400.0)
RBC: 4.74 Mil/uL (ref 3.87–5.11)
RDW: 13.5 % (ref 11.5–15.5)
WBC: 6 10*3/uL (ref 4.0–10.5)

## 2022-11-08 LAB — FERRITIN: Ferritin: 10.6 ng/mL (ref 10.0–291.0)

## 2022-11-08 LAB — VITAMIN D 25 HYDROXY (VIT D DEFICIENCY, FRACTURES): VITD: 70.16 ng/mL (ref 30.00–100.00)

## 2022-11-08 LAB — TSH: TSH: 1.6 u[IU]/mL (ref 0.35–5.50)

## 2022-11-08 LAB — HEMOGLOBIN A1C: Hgb A1c MFr Bld: 5.9 % (ref 4.6–6.5)

## 2022-11-08 MED ORDER — AMPHETAMINE-DEXTROAMPHETAMINE 10 MG PO TABS
10.0000 mg | ORAL_TABLET | Freq: Every day | ORAL | 0 refills | Status: AC
Start: 2022-11-08 — End: ?

## 2022-11-08 NOTE — Patient Instructions (Signed)
Good to see you today!   2nd dose of shingrix given today Referral for a sleep study in [place] placed Please set up your mammogram I will be in touch with your labs asap Will set up for the circulation test that you need for your legs

## 2022-11-09 ENCOUNTER — Encounter: Payer: Self-pay | Admitting: Family Medicine

## 2022-11-09 LAB — LIPID PANEL
Cholesterol: 152 mg/dL (ref 0–200)
HDL: 70.8 mg/dL (ref >39.00)
LDL Cholesterol: 70 mg/dL (ref 0–99)
NonHDL: 81.2
Total CHOL/HDL Ratio: 2
Triglycerides: 54 mg/dL (ref 0.0–149.0)
VLDL: 10.8 mg/dL (ref 0.0–40.0)

## 2022-11-09 LAB — MICROALBUMIN / CREATININE URINE RATIO
Creatinine,U: 70.2 mg/dL
Microalb Creat Ratio: 2.5 mg/g (ref 0.0–30.0)
Microalb, Ur: 1.8 mg/dL (ref 0.0–1.9)

## 2022-11-09 LAB — COMPREHENSIVE METABOLIC PANEL
ALT: 17 U/L (ref 0–35)
AST: 19 U/L (ref 0–37)
Albumin: 4.1 g/dL (ref 3.5–5.2)
Alkaline Phosphatase: 50 U/L (ref 39–117)
BUN: 9 mg/dL (ref 6–23)
CO2: 25 mEq/L (ref 19–32)
Calcium: 9.3 mg/dL (ref 8.4–10.5)
Chloride: 101 mEq/L (ref 96–112)
Creatinine, Ser: 0.55 mg/dL (ref 0.40–1.20)
GFR: 106.05 mL/min (ref >60.00)
Glucose, Bld: 88 mg/dL (ref 70–99)
Potassium: 4.6 mEq/L (ref 3.5–5.1)
Sodium: 139 mEq/L (ref 135–145)
Total Bilirubin: 0.4 mg/dL (ref 0.2–1.2)
Total Protein: 6.8 g/dL (ref 6.0–8.3)

## 2022-11-16 ENCOUNTER — Ambulatory Visit (HOSPITAL_COMMUNITY)
Admission: RE | Admit: 2022-11-16 | Discharge: 2022-11-16 | Disposition: A | Discharge status: Home / Self Care | Payer: BLUE CROSS/BLUE SHIELD | Source: Ambulatory Visit | Attending: Family Medicine | Admitting: Family Medicine

## 2022-11-16 ENCOUNTER — Encounter: Payer: Self-pay | Admitting: Family Medicine

## 2022-11-16 DIAGNOSIS — M79661 Pain in right lower leg: Secondary | ICD-10-CM | POA: Diagnosis not present

## 2022-11-16 DIAGNOSIS — M79662 Pain in left lower leg: Secondary | ICD-10-CM | POA: Diagnosis not present

## 2022-11-17 LAB — VAS US ABI WITH/WO TBI
Left ABI: 1.12
Right ABI: 1.12

## 2022-11-29 ENCOUNTER — Other Ambulatory Visit: Payer: Self-pay | Admitting: Family Medicine

## 2022-11-29 DIAGNOSIS — E785 Hyperlipidemia, unspecified: Secondary | ICD-10-CM

## 2022-11-30 DIAGNOSIS — M546 Pain in thoracic spine: Secondary | ICD-10-CM | POA: Diagnosis not present

## 2022-11-30 DIAGNOSIS — M9902 Segmental and somatic dysfunction of thoracic region: Secondary | ICD-10-CM | POA: Diagnosis not present

## 2022-11-30 DIAGNOSIS — M9901 Segmental and somatic dysfunction of cervical region: Secondary | ICD-10-CM | POA: Diagnosis not present

## 2022-11-30 DIAGNOSIS — M542 Cervicalgia: Secondary | ICD-10-CM | POA: Diagnosis not present

## 2022-12-06 DIAGNOSIS — M9901 Segmental and somatic dysfunction of cervical region: Secondary | ICD-10-CM | POA: Diagnosis not present

## 2022-12-06 DIAGNOSIS — M542 Cervicalgia: Secondary | ICD-10-CM | POA: Diagnosis not present

## 2022-12-06 DIAGNOSIS — M546 Pain in thoracic spine: Secondary | ICD-10-CM | POA: Diagnosis not present

## 2022-12-06 DIAGNOSIS — M9902 Segmental and somatic dysfunction of thoracic region: Secondary | ICD-10-CM | POA: Diagnosis not present

## 2022-12-13 DIAGNOSIS — M542 Cervicalgia: Secondary | ICD-10-CM | POA: Diagnosis not present

## 2022-12-13 DIAGNOSIS — M9901 Segmental and somatic dysfunction of cervical region: Secondary | ICD-10-CM | POA: Diagnosis not present

## 2022-12-13 DIAGNOSIS — M9902 Segmental and somatic dysfunction of thoracic region: Secondary | ICD-10-CM | POA: Diagnosis not present

## 2022-12-13 DIAGNOSIS — M546 Pain in thoracic spine: Secondary | ICD-10-CM | POA: Diagnosis not present

## 2022-12-19 DIAGNOSIS — M542 Cervicalgia: Secondary | ICD-10-CM | POA: Diagnosis not present

## 2022-12-19 DIAGNOSIS — M546 Pain in thoracic spine: Secondary | ICD-10-CM | POA: Diagnosis not present

## 2022-12-19 DIAGNOSIS — M9901 Segmental and somatic dysfunction of cervical region: Secondary | ICD-10-CM | POA: Diagnosis not present

## 2022-12-19 DIAGNOSIS — M9902 Segmental and somatic dysfunction of thoracic region: Secondary | ICD-10-CM | POA: Diagnosis not present

## 2022-12-26 DIAGNOSIS — M9901 Segmental and somatic dysfunction of cervical region: Secondary | ICD-10-CM | POA: Diagnosis not present

## 2022-12-26 DIAGNOSIS — M9902 Segmental and somatic dysfunction of thoracic region: Secondary | ICD-10-CM | POA: Diagnosis not present

## 2022-12-26 DIAGNOSIS — M542 Cervicalgia: Secondary | ICD-10-CM | POA: Diagnosis not present

## 2022-12-26 DIAGNOSIS — M546 Pain in thoracic spine: Secondary | ICD-10-CM | POA: Diagnosis not present

## 2023-01-04 DIAGNOSIS — F330 Major depressive disorder, recurrent, mild: Secondary | ICD-10-CM | POA: Diagnosis not present

## 2023-01-04 DIAGNOSIS — F900 Attention-deficit hyperactivity disorder, predominantly inattentive type: Secondary | ICD-10-CM | POA: Diagnosis not present

## 2023-01-04 DIAGNOSIS — F411 Generalized anxiety disorder: Secondary | ICD-10-CM | POA: Diagnosis not present

## 2023-01-08 DIAGNOSIS — M546 Pain in thoracic spine: Secondary | ICD-10-CM | POA: Diagnosis not present

## 2023-01-08 DIAGNOSIS — M9902 Segmental and somatic dysfunction of thoracic region: Secondary | ICD-10-CM | POA: Diagnosis not present

## 2023-01-08 DIAGNOSIS — M542 Cervicalgia: Secondary | ICD-10-CM | POA: Diagnosis not present

## 2023-01-08 DIAGNOSIS — M9901 Segmental and somatic dysfunction of cervical region: Secondary | ICD-10-CM | POA: Diagnosis not present

## 2023-01-16 ENCOUNTER — Other Ambulatory Visit: Payer: Self-pay

## 2023-01-16 DIAGNOSIS — E119 Type 2 diabetes mellitus without complications: Secondary | ICD-10-CM

## 2023-01-16 MED ORDER — METFORMIN HCL 1000 MG PO TABS
1000.0000 mg | ORAL_TABLET | Freq: Two times a day (BID) | ORAL | 3 refills | Status: DC
Start: 2023-01-16 — End: 2024-02-13

## 2023-01-23 DIAGNOSIS — M546 Pain in thoracic spine: Secondary | ICD-10-CM | POA: Diagnosis not present

## 2023-01-23 DIAGNOSIS — M9901 Segmental and somatic dysfunction of cervical region: Secondary | ICD-10-CM | POA: Diagnosis not present

## 2023-01-23 DIAGNOSIS — M9902 Segmental and somatic dysfunction of thoracic region: Secondary | ICD-10-CM | POA: Diagnosis not present

## 2023-01-23 DIAGNOSIS — M542 Cervicalgia: Secondary | ICD-10-CM | POA: Diagnosis not present

## 2023-01-26 DIAGNOSIS — F900 Attention-deficit hyperactivity disorder, predominantly inattentive type: Secondary | ICD-10-CM | POA: Diagnosis not present

## 2023-01-26 DIAGNOSIS — F330 Major depressive disorder, recurrent, mild: Secondary | ICD-10-CM | POA: Diagnosis not present

## 2023-01-26 DIAGNOSIS — F411 Generalized anxiety disorder: Secondary | ICD-10-CM | POA: Diagnosis not present

## 2023-02-02 ENCOUNTER — Other Ambulatory Visit: Payer: Self-pay | Admitting: Family Medicine

## 2023-02-02 DIAGNOSIS — E034 Atrophy of thyroid (acquired): Secondary | ICD-10-CM

## 2023-02-06 DIAGNOSIS — M542 Cervicalgia: Secondary | ICD-10-CM | POA: Diagnosis not present

## 2023-02-06 DIAGNOSIS — M9901 Segmental and somatic dysfunction of cervical region: Secondary | ICD-10-CM | POA: Diagnosis not present

## 2023-02-06 DIAGNOSIS — M546 Pain in thoracic spine: Secondary | ICD-10-CM | POA: Diagnosis not present

## 2023-02-06 DIAGNOSIS — M9902 Segmental and somatic dysfunction of thoracic region: Secondary | ICD-10-CM | POA: Diagnosis not present

## 2023-02-08 DIAGNOSIS — M9902 Segmental and somatic dysfunction of thoracic region: Secondary | ICD-10-CM | POA: Diagnosis not present

## 2023-02-08 DIAGNOSIS — M546 Pain in thoracic spine: Secondary | ICD-10-CM | POA: Diagnosis not present

## 2023-02-08 DIAGNOSIS — M9901 Segmental and somatic dysfunction of cervical region: Secondary | ICD-10-CM | POA: Diagnosis not present

## 2023-02-08 DIAGNOSIS — M5411 Radiculopathy, occipito-atlanto-axial region: Secondary | ICD-10-CM | POA: Diagnosis not present

## 2023-02-09 DIAGNOSIS — W5329XA Other contact with squirrel, initial encounter: Secondary | ICD-10-CM | POA: Diagnosis not present

## 2023-02-09 DIAGNOSIS — S60511A Abrasion of right hand, initial encounter: Secondary | ICD-10-CM | POA: Diagnosis not present

## 2023-02-09 DIAGNOSIS — Z23 Encounter for immunization: Secondary | ICD-10-CM | POA: Diagnosis not present

## 2023-02-16 DIAGNOSIS — F411 Generalized anxiety disorder: Secondary | ICD-10-CM | POA: Diagnosis not present

## 2023-02-16 DIAGNOSIS — F330 Major depressive disorder, recurrent, mild: Secondary | ICD-10-CM | POA: Diagnosis not present

## 2023-02-16 DIAGNOSIS — F900 Attention-deficit hyperactivity disorder, predominantly inattentive type: Secondary | ICD-10-CM | POA: Diagnosis not present

## 2023-02-17 DIAGNOSIS — J029 Acute pharyngitis, unspecified: Secondary | ICD-10-CM | POA: Diagnosis not present

## 2023-02-20 DIAGNOSIS — M5411 Radiculopathy, occipito-atlanto-axial region: Secondary | ICD-10-CM | POA: Diagnosis not present

## 2023-02-20 DIAGNOSIS — M9901 Segmental and somatic dysfunction of cervical region: Secondary | ICD-10-CM | POA: Diagnosis not present

## 2023-02-20 DIAGNOSIS — M9902 Segmental and somatic dysfunction of thoracic region: Secondary | ICD-10-CM | POA: Diagnosis not present

## 2023-02-20 DIAGNOSIS — M546 Pain in thoracic spine: Secondary | ICD-10-CM | POA: Diagnosis not present

## 2023-03-06 DIAGNOSIS — M546 Pain in thoracic spine: Secondary | ICD-10-CM | POA: Diagnosis not present

## 2023-03-06 DIAGNOSIS — M9901 Segmental and somatic dysfunction of cervical region: Secondary | ICD-10-CM | POA: Diagnosis not present

## 2023-03-06 DIAGNOSIS — M5411 Radiculopathy, occipito-atlanto-axial region: Secondary | ICD-10-CM | POA: Diagnosis not present

## 2023-03-06 DIAGNOSIS — M9902 Segmental and somatic dysfunction of thoracic region: Secondary | ICD-10-CM | POA: Diagnosis not present

## 2023-03-09 DIAGNOSIS — M5411 Radiculopathy, occipito-atlanto-axial region: Secondary | ICD-10-CM | POA: Diagnosis not present

## 2023-03-09 DIAGNOSIS — M9901 Segmental and somatic dysfunction of cervical region: Secondary | ICD-10-CM | POA: Diagnosis not present

## 2023-03-09 DIAGNOSIS — F330 Major depressive disorder, recurrent, mild: Secondary | ICD-10-CM | POA: Diagnosis not present

## 2023-03-09 DIAGNOSIS — M546 Pain in thoracic spine: Secondary | ICD-10-CM | POA: Diagnosis not present

## 2023-03-09 DIAGNOSIS — M9902 Segmental and somatic dysfunction of thoracic region: Secondary | ICD-10-CM | POA: Diagnosis not present

## 2023-03-09 DIAGNOSIS — F900 Attention-deficit hyperactivity disorder, predominantly inattentive type: Secondary | ICD-10-CM | POA: Diagnosis not present

## 2023-03-09 DIAGNOSIS — F411 Generalized anxiety disorder: Secondary | ICD-10-CM | POA: Diagnosis not present

## 2023-03-13 DIAGNOSIS — M5411 Radiculopathy, occipito-atlanto-axial region: Secondary | ICD-10-CM | POA: Diagnosis not present

## 2023-03-13 DIAGNOSIS — M9901 Segmental and somatic dysfunction of cervical region: Secondary | ICD-10-CM | POA: Diagnosis not present

## 2023-03-13 DIAGNOSIS — M546 Pain in thoracic spine: Secondary | ICD-10-CM | POA: Diagnosis not present

## 2023-03-13 DIAGNOSIS — M9902 Segmental and somatic dysfunction of thoracic region: Secondary | ICD-10-CM | POA: Diagnosis not present

## 2023-03-20 DIAGNOSIS — M9902 Segmental and somatic dysfunction of thoracic region: Secondary | ICD-10-CM | POA: Diagnosis not present

## 2023-03-20 DIAGNOSIS — M546 Pain in thoracic spine: Secondary | ICD-10-CM | POA: Diagnosis not present

## 2023-03-20 DIAGNOSIS — M9901 Segmental and somatic dysfunction of cervical region: Secondary | ICD-10-CM | POA: Diagnosis not present

## 2023-03-20 DIAGNOSIS — M5411 Radiculopathy, occipito-atlanto-axial region: Secondary | ICD-10-CM | POA: Diagnosis not present

## 2023-04-04 DIAGNOSIS — M546 Pain in thoracic spine: Secondary | ICD-10-CM | POA: Diagnosis not present

## 2023-04-04 DIAGNOSIS — M9902 Segmental and somatic dysfunction of thoracic region: Secondary | ICD-10-CM | POA: Diagnosis not present

## 2023-04-04 DIAGNOSIS — M9901 Segmental and somatic dysfunction of cervical region: Secondary | ICD-10-CM | POA: Diagnosis not present

## 2023-04-04 DIAGNOSIS — M5411 Radiculopathy, occipito-atlanto-axial region: Secondary | ICD-10-CM | POA: Diagnosis not present

## 2023-04-05 DIAGNOSIS — Z79890 Hormone replacement therapy (postmenopausal): Secondary | ICD-10-CM | POA: Diagnosis not present

## 2023-04-06 DIAGNOSIS — M9901 Segmental and somatic dysfunction of cervical region: Secondary | ICD-10-CM | POA: Diagnosis not present

## 2023-04-06 DIAGNOSIS — M9902 Segmental and somatic dysfunction of thoracic region: Secondary | ICD-10-CM | POA: Diagnosis not present

## 2023-04-06 DIAGNOSIS — M546 Pain in thoracic spine: Secondary | ICD-10-CM | POA: Diagnosis not present

## 2023-04-06 DIAGNOSIS — M5411 Radiculopathy, occipito-atlanto-axial region: Secondary | ICD-10-CM | POA: Diagnosis not present

## 2023-04-10 DIAGNOSIS — M79604 Pain in right leg: Secondary | ICD-10-CM | POA: Diagnosis not present

## 2023-04-10 DIAGNOSIS — M5431 Sciatica, right side: Secondary | ICD-10-CM | POA: Diagnosis not present

## 2023-04-10 DIAGNOSIS — M6281 Muscle weakness (generalized): Secondary | ICD-10-CM | POA: Diagnosis not present

## 2023-04-10 DIAGNOSIS — M25551 Pain in right hip: Secondary | ICD-10-CM | POA: Diagnosis not present

## 2023-04-13 DIAGNOSIS — M79604 Pain in right leg: Secondary | ICD-10-CM | POA: Diagnosis not present

## 2023-04-13 DIAGNOSIS — M6281 Muscle weakness (generalized): Secondary | ICD-10-CM | POA: Diagnosis not present

## 2023-04-13 DIAGNOSIS — M25551 Pain in right hip: Secondary | ICD-10-CM | POA: Diagnosis not present

## 2023-04-13 DIAGNOSIS — F330 Major depressive disorder, recurrent, mild: Secondary | ICD-10-CM | POA: Diagnosis not present

## 2023-04-13 DIAGNOSIS — F411 Generalized anxiety disorder: Secondary | ICD-10-CM | POA: Diagnosis not present

## 2023-04-13 DIAGNOSIS — M5431 Sciatica, right side: Secondary | ICD-10-CM | POA: Diagnosis not present

## 2023-04-13 DIAGNOSIS — F900 Attention-deficit hyperactivity disorder, predominantly inattentive type: Secondary | ICD-10-CM | POA: Diagnosis not present

## 2023-04-16 DIAGNOSIS — Z79890 Hormone replacement therapy (postmenopausal): Secondary | ICD-10-CM | POA: Diagnosis not present

## 2023-04-19 DIAGNOSIS — M5431 Sciatica, right side: Secondary | ICD-10-CM | POA: Diagnosis not present

## 2023-04-19 DIAGNOSIS — M6281 Muscle weakness (generalized): Secondary | ICD-10-CM | POA: Diagnosis not present

## 2023-04-19 DIAGNOSIS — M25551 Pain in right hip: Secondary | ICD-10-CM | POA: Diagnosis not present

## 2023-04-19 DIAGNOSIS — M79604 Pain in right leg: Secondary | ICD-10-CM | POA: Diagnosis not present

## 2023-04-23 DIAGNOSIS — M79604 Pain in right leg: Secondary | ICD-10-CM | POA: Diagnosis not present

## 2023-04-23 DIAGNOSIS — M5431 Sciatica, right side: Secondary | ICD-10-CM | POA: Diagnosis not present

## 2023-04-23 DIAGNOSIS — M6281 Muscle weakness (generalized): Secondary | ICD-10-CM | POA: Diagnosis not present

## 2023-04-23 DIAGNOSIS — M25551 Pain in right hip: Secondary | ICD-10-CM | POA: Diagnosis not present

## 2023-04-25 DIAGNOSIS — M25551 Pain in right hip: Secondary | ICD-10-CM | POA: Diagnosis not present

## 2023-04-25 DIAGNOSIS — M6281 Muscle weakness (generalized): Secondary | ICD-10-CM | POA: Diagnosis not present

## 2023-04-25 DIAGNOSIS — M79604 Pain in right leg: Secondary | ICD-10-CM | POA: Diagnosis not present

## 2023-04-25 DIAGNOSIS — M5431 Sciatica, right side: Secondary | ICD-10-CM | POA: Diagnosis not present

## 2023-04-30 DIAGNOSIS — M25551 Pain in right hip: Secondary | ICD-10-CM | POA: Diagnosis not present

## 2023-04-30 DIAGNOSIS — M6281 Muscle weakness (generalized): Secondary | ICD-10-CM | POA: Diagnosis not present

## 2023-04-30 DIAGNOSIS — M5431 Sciatica, right side: Secondary | ICD-10-CM | POA: Diagnosis not present

## 2023-04-30 DIAGNOSIS — M79604 Pain in right leg: Secondary | ICD-10-CM | POA: Diagnosis not present

## 2023-05-16 DIAGNOSIS — M25551 Pain in right hip: Secondary | ICD-10-CM | POA: Diagnosis not present

## 2023-05-16 DIAGNOSIS — M79604 Pain in right leg: Secondary | ICD-10-CM | POA: Diagnosis not present

## 2023-05-16 DIAGNOSIS — M6281 Muscle weakness (generalized): Secondary | ICD-10-CM | POA: Diagnosis not present

## 2023-05-16 DIAGNOSIS — M5431 Sciatica, right side: Secondary | ICD-10-CM | POA: Diagnosis not present

## 2023-05-23 ENCOUNTER — Other Ambulatory Visit: Payer: Self-pay | Admitting: Family Medicine

## 2023-05-23 DIAGNOSIS — S43402A Unspecified sprain of left shoulder joint, initial encounter: Secondary | ICD-10-CM

## 2023-05-23 DIAGNOSIS — R252 Cramp and spasm: Secondary | ICD-10-CM

## 2023-06-01 DIAGNOSIS — F330 Major depressive disorder, recurrent, mild: Secondary | ICD-10-CM | POA: Diagnosis not present

## 2023-06-01 DIAGNOSIS — F411 Generalized anxiety disorder: Secondary | ICD-10-CM | POA: Diagnosis not present

## 2023-06-01 DIAGNOSIS — F900 Attention-deficit hyperactivity disorder, predominantly inattentive type: Secondary | ICD-10-CM | POA: Diagnosis not present

## 2023-08-19 ENCOUNTER — Encounter: Payer: Self-pay | Admitting: Family Medicine

## 2023-08-19 DIAGNOSIS — N6310 Unspecified lump in the right breast, unspecified quadrant: Secondary | ICD-10-CM

## 2023-09-10 NOTE — Addendum Note (Signed)
 Addended by: Abbe Amsterdam C on: 09/10/2023 12:03 PM   Modules accepted: Orders

## 2023-09-25 ENCOUNTER — Other Ambulatory Visit: Payer: Self-pay | Admitting: Family Medicine

## 2023-09-25 DIAGNOSIS — E1165 Type 2 diabetes mellitus with hyperglycemia: Secondary | ICD-10-CM

## 2023-11-26 ENCOUNTER — Other Ambulatory Visit: Payer: Self-pay | Admitting: Family Medicine

## 2023-11-26 DIAGNOSIS — E785 Hyperlipidemia, unspecified: Secondary | ICD-10-CM

## 2023-12-24 ENCOUNTER — Other Ambulatory Visit: Payer: Self-pay | Admitting: Family Medicine

## 2023-12-24 DIAGNOSIS — E1165 Type 2 diabetes mellitus with hyperglycemia: Secondary | ICD-10-CM

## 2024-02-04 ENCOUNTER — Other Ambulatory Visit: Payer: Self-pay | Admitting: Family Medicine

## 2024-02-04 DIAGNOSIS — E034 Atrophy of thyroid (acquired): Secondary | ICD-10-CM

## 2024-02-12 ENCOUNTER — Other Ambulatory Visit: Payer: Self-pay | Admitting: Family Medicine

## 2024-02-12 DIAGNOSIS — E119 Type 2 diabetes mellitus without complications: Secondary | ICD-10-CM

## 2024-04-07 ENCOUNTER — Encounter: Payer: Self-pay | Admitting: Family Medicine
